# Patient Record
Sex: Male | Born: 1955 | Race: White | Hispanic: No | Marital: Married | Smoking: Former smoker
Health system: Southern US, Community
[De-identification: ages and names within clinical notes are randomized; demographics above are authoritative.]

## PROBLEM LIST (undated history)

## (undated) DIAGNOSIS — K219 Gastro-esophageal reflux disease without esophagitis: Secondary | ICD-10-CM

## (undated) DIAGNOSIS — M5412 Radiculopathy, cervical region: Secondary | ICD-10-CM

## (undated) DIAGNOSIS — M1711 Unilateral primary osteoarthritis, right knee: Secondary | ICD-10-CM

## (undated) DIAGNOSIS — E785 Hyperlipidemia, unspecified: Secondary | ICD-10-CM

## (undated) DIAGNOSIS — M545 Low back pain, unspecified: Secondary | ICD-10-CM

## (undated) HISTORY — PX: NASAL SINUS SURGERY: SHX719

## (undated) HISTORY — PX: COLONOSCOPY: SHX174

## (undated) HISTORY — DX: Radiculopathy, cervical region: M54.12

## (undated) HISTORY — PX: HEMORRHOID SURGERY: SHX153

## (undated) HISTORY — DX: Hyperlipidemia, unspecified: E78.5

## (undated) HISTORY — DX: Unilateral primary osteoarthritis, right knee: M17.11

## (undated) HISTORY — DX: Gastro-esophageal reflux disease without esophagitis: K21.9

## (undated) HISTORY — DX: Low back pain: M54.5

## (undated) HISTORY — DX: Low back pain, unspecified: M54.50

---

## 2010-05-22 ENCOUNTER — Other Ambulatory Visit: Payer: Self-pay | Admitting: Chiropractic Medicine

## 2010-05-22 DIAGNOSIS — M545 Low back pain: Secondary | ICD-10-CM

## 2010-05-22 DIAGNOSIS — M999 Biomechanical lesion, unspecified: Secondary | ICD-10-CM

## 2010-05-22 DIAGNOSIS — M546 Pain in thoracic spine: Secondary | ICD-10-CM

## 2010-05-28 ENCOUNTER — Other Ambulatory Visit: Payer: Self-pay

## 2010-09-05 ENCOUNTER — Other Ambulatory Visit: Payer: Self-pay | Admitting: Specialist

## 2010-09-05 DIAGNOSIS — M549 Dorsalgia, unspecified: Secondary | ICD-10-CM

## 2011-04-02 ENCOUNTER — Encounter: Payer: Self-pay | Admitting: Family Medicine

## 2011-04-02 ENCOUNTER — Ambulatory Visit (INDEPENDENT_AMBULATORY_CARE_PROVIDER_SITE_OTHER): Payer: PRIVATE HEALTH INSURANCE | Admitting: Family Medicine

## 2011-04-02 VITALS — BP 120/83 | HR 67 | Temp 98.1°F | Ht 71.5 in | Wt 182.0 lb

## 2011-04-02 DIAGNOSIS — K219 Gastro-esophageal reflux disease without esophagitis: Secondary | ICD-10-CM | POA: Insufficient documentation

## 2011-04-02 DIAGNOSIS — M7750 Other enthesopathy of unspecified foot: Secondary | ICD-10-CM

## 2011-04-02 DIAGNOSIS — Z0000 Encounter for general adult medical examination without abnormal findings: Secondary | ICD-10-CM

## 2011-04-02 DIAGNOSIS — Z1283 Encounter for screening for malignant neoplasm of skin: Secondary | ICD-10-CM

## 2011-04-02 DIAGNOSIS — Z125 Encounter for screening for malignant neoplasm of prostate: Secondary | ICD-10-CM

## 2011-04-02 DIAGNOSIS — M7741 Metatarsalgia, right foot: Secondary | ICD-10-CM | POA: Insufficient documentation

## 2011-04-02 DIAGNOSIS — Z23 Encounter for immunization: Secondary | ICD-10-CM

## 2011-04-02 MED ORDER — OMEPRAZOLE 20 MG PO CPDR: 20.0000 mg | DELAYED_RELEASE_CAPSULE | Freq: Every day | ORAL | Status: DC

## 2011-04-02 NOTE — Assessment & Plan Note (Signed)
DRE normal today. PSA to be done with fasting blood work soon.

## 2011-04-02 NOTE — Assessment & Plan Note (Signed)
Recommended metatarsal pad for right foot, continue with relative rest, and I gave samples of celebrex 200mg  to take 1 qd with food x 10d. Therapeutic expectations and side effect profile of medication discussed today.  Patient's questions answered.

## 2011-04-02 NOTE — Progress Notes (Signed)
Office Note 04/02/2011  CC:  Chief Complaint  Patient presents with  . Establish Care    wants CPE, no problems    HPI:  Brandon Nunez is a 56 y.o. White male who is here to establish care, get CPE. Patient's most recent primary MD: Dr. Alanda Slim in [facility] [place]. Old records were not reviewed prior to or during today's visit.  Has long hx of GERD, has been treating only with antacids daily, says he never has been on a PPI or H2 blocker long-term.  Has had endoscopy in the past, sounds like gastritis was noted, took PPI briefly after this and adjusted diet briefly.  However, he says he won't adjust diet now--"I live on spicy food". Has bad halitosis and wonders if it could be connected to his GERD.  Also describes hx of (distant past) episodes of unexplained vague chest pains, nonexertional---wonders now if these were GERD related (felt different than his typical heartburn).  Also, about 1 wk hx of pain on bottom of right foot in the area of the ball of the foot, has been doing stair stepper exercise more lately---low impact but repetitive.  No running lately.  NO pain unless he is wt bearing.  It is causing him to limp, but he feels like it is getting better the last 1-2 days.  No heel pain.    Past Medical History  Diagnosis Date  . GERD (gastroesophageal reflux disease)   . Low back pain, episodic     MRI unremarkable at orthopedist's 06-19-10    Past Surgical History  Procedure Date  . Hemorrhoid surgery approx 1998  . Colonoscopy approx Jun 18, 2008    Normal pet pt: was told rpt 10 yrs    Family History  Problem Relation Age of Onset  . Cancer Mother     skin (unknown type per pt)  . Cancer Sister     lung (+smoker)    History   Social History  . Marital Status: Married    Spouse Name: N/A    Number of Children: N/A  . Years of Education: N/A   Occupational History  . Not on file.   Social History Main Topics  . Smoking status: Current Some Day Smoker   Types: Cigars  . Smokeless tobacco: Never Used  . Alcohol Use: Yes     wine, whiskey daily  . Drug Use: No  . Sexually Active: Not on file   Other Topics Concern  . 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    MEDS: Rolaids or tums daily  No Known Allergies  ROS Review of Systems  Constitutional: Negative for fever, chills, appetite change and fatigue.  HENT: Negative for ear pain, congestion, sore throat, neck stiffness and dental problem.   Eyes: Negative for discharge, redness and visual disturbance.  Respiratory: Negative for cough, chest tightness, shortness of breath and wheezing.   Cardiovascular: Negative for chest pain, palpitations and leg swelling.  Gastrointestinal: Negative for nausea, vomiting, abdominal pain, diarrhea and blood in stool.  Genitourinary: Negative for dysuria, urgency, frequency, hematuria, flank  pain and difficulty urinating.  Musculoskeletal: Positive for gait problem (limp, see HPI). Negative for myalgias, back pain, joint swelling and arthralgias.  Skin: Negative for pallor and rash.  Neurological: Negative for dizziness, speech difficulty, weakness and headaches.  Hematological: Negative for adenopathy. Does not bruise/bleed easily.  Psychiatric/Behavioral: Negative for confusion, sleep disturbance and dysphoric mood. The patient is not nervous/anxious.     PE; Blood pressure 120/83, pulse 67, temperature 98.1 F (36.7 C), temperature source Temporal, height 5' 11.5" (1.816 m), weight 182 lb (82.555 kg), SpO2  97.00%. Gen: Alert, well appearing.  Patient is oriented to person, place, time, and situation. ENT: Ears: EACs clear, normal epithelium.  TMs with good light reflex and landmarks bilaterally.  Eyes: no injection, icteris, swelling, or exudate.  EOMI, PERRLA. Nose: no drainage or turbinate edema/swelling.  No injection or focal lesion.  Mouth: lips without lesion/swelling.  Oral mucosa pink and moist.  Dentition intact and without obvious caries or gingival swelling.  Oropharynx without erythema, exudate, or swelling.  Neck - No masses or thyromegaly or limitation in range of motion CV: RRR, no m/r/g.   LUNGS: CTA bilat, nonlabored resps, good aeration in all lung fields. ABD: soft, NT, ND, BS normal.  No hepatospenomegaly or mass.  No bruits. EXT: no clubbing, cyanosis, or edema. Right plantar surface with very mild TTP in the area around the metatarsal heads diffusely.  No swelling. No erythema.  No TTP of plantar fascia or heel. Genitals normal; both testes normal without tenderness, masses, hydroceles, varicoceles, erythema or swelling. Shaft normal, uncircumcised, meatus normal without discharge. No inguinal hernia noted. No inguinal lymphadenopathy. Rectal exam: several perianal skin tags noted externally, no internal lesions or tenderness; prostate small, smooth and without nodularity or tenderness.  Pertinent labs:  None today  ASSESSMENT AND PLAN:   New pt: obtain old records.  Health maintenance examination Reviewed age and gender appropriate health maintenance issues (prudent diet, regular exercise, health risks of tobacco and excessive alcohol, use of seatbelts, fire alarms in home, use of sunscreen).  Also reviewed age and gender appropriate health screening as well as vaccine recommendations. He is UTD on colon cancer screening: repeat colonoscopy approx 2020. He declined flu vaccine today. He did accept the Tdap vaccine today. He'll make lab appt ASAP to return for fasting  lipids, CBC, CMET, and TSH.  Skin cancer screening No atypical lesions noted on exam today. Pt says he has a routine wherever he lives to go to a skin doctor annually for a full skin check so he requested referral today for this.  I ordered a referral to Dr. Jorja Loa for this.  Prostate cancer screening DRE normal today. PSA to be done with fasting blood work soon.  Metatarsalgia of right foot Recommended metatarsal pad for right foot, continue with relative rest, and I gave samples of celebrex 200mg  to take 1 qd with food x 10d. Therapeutic expectations and side effect profile of medication discussed today.  Patient's questions answered.   GERD (gastroesophageal reflux disease) With halitosis and hx of atypical CP : start daily PPI--prilosec OTC 20mg  rx'd today, 1 tab po qd, #30, RF x 11.      Return for Lab visit ASAP for fasting lab work.  Next office visit in 1 yr for CPE.

## 2011-04-02 NOTE — Assessment & Plan Note (Addendum)
Reviewed age and gender appropriate health maintenance issues (prudent diet, regular exercise, health risks of tobacco and excessive alcohol, use of seatbelts, fire alarms in home, use of sunscreen).  Also reviewed age and gender appropriate health screening as well as vaccine recommendations. He is UTD on colon cancer screening: repeat colonoscopy approx 2020. He declined flu vaccine today. He did accept the Tdap vaccine today. He'll make lab appt ASAP to return for fasting lipids, CBC, CMET, and TSH.

## 2011-04-02 NOTE — Assessment & Plan Note (Signed)
No atypical lesions noted on exam today. Pt says he has a routine wherever he lives to go to a skin doctor annually for a full skin check so he requested referral today for this.  I ordered a referral to Dr. Jorja Loa for this.

## 2011-04-02 NOTE — Assessment & Plan Note (Signed)
With halitosis and hx of atypical CP : start daily PPI--prilosec OTC 20mg  rx'd today, 1 tab po qd, #30, RF x 11.

## 2011-04-08 ENCOUNTER — Other Ambulatory Visit (INDEPENDENT_AMBULATORY_CARE_PROVIDER_SITE_OTHER): Payer: PRIVATE HEALTH INSURANCE

## 2011-04-08 DIAGNOSIS — Z0000 Encounter for general adult medical examination without abnormal findings: Secondary | ICD-10-CM

## 2011-04-08 DIAGNOSIS — Z125 Encounter for screening for malignant neoplasm of prostate: Secondary | ICD-10-CM

## 2011-04-08 LAB — LIPID PANEL
Cholesterol: 202 mg/dL — ABNORMAL HIGH (ref 0–200)
HDL: 40.9 mg/dL (ref >39.00)
VLDL: 18.4 mg/dL (ref 0.0–40.0)

## 2011-04-08 LAB — COMPREHENSIVE METABOLIC PANEL
AST: 23 U/L (ref 0–37)
Albumin: 3.9 g/dL (ref 3.5–5.2)
Alkaline Phosphatase: 56 U/L (ref 39–117)
BUN: 18 mg/dL (ref 6–23)
Calcium: 8.8 mg/dL (ref 8.4–10.5)
Creatinine, Ser: 1.1 mg/dL (ref 0.4–1.5)
Glucose, Bld: 104 mg/dL — ABNORMAL HIGH (ref 70–99)

## 2011-04-08 LAB — CBC WITH DIFFERENTIAL/PLATELET
Basophils Absolute: 0 10*3/uL (ref 0.0–0.1)
Eosinophils Absolute: 0.2 10*3/uL (ref 0.0–0.7)
Lymphocytes Relative: 30.9 % (ref 12.0–46.0)
MCHC: 33.2 g/dL (ref 30.0–36.0)
Monocytes Relative: 6.4 % (ref 3.0–12.0)
Platelets: 151 10*3/uL (ref 150.0–400.0)
RDW: 14.5 % (ref 11.5–14.6)

## 2011-04-08 LAB — TSH: TSH: 2.23 u[IU]/mL (ref 0.35–5.50)

## 2011-04-08 LAB — PSA: PSA: 1.85 ng/mL (ref 0.10–4.00)

## 2011-04-08 LAB — LDL CHOLESTEROL, DIRECT: Direct LDL: 153.5 mg/dL

## 2011-04-26 ENCOUNTER — Encounter: Payer: Self-pay | Admitting: Family Medicine

## 2011-04-26 ENCOUNTER — Ambulatory Visit (INDEPENDENT_AMBULATORY_CARE_PROVIDER_SITE_OTHER): Payer: PRIVATE HEALTH INSURANCE | Admitting: Family Medicine

## 2011-04-26 VITALS — BP 117/80 | HR 52 | Temp 98.0°F | Ht 71.5 in | Wt 183.0 lb

## 2011-04-26 DIAGNOSIS — IMO0002 Reserved for concepts with insufficient information to code with codable children: Secondary | ICD-10-CM

## 2011-04-26 DIAGNOSIS — M7050 Other bursitis of knee, unspecified knee: Secondary | ICD-10-CM | POA: Insufficient documentation

## 2011-04-26 NOTE — Assessment & Plan Note (Signed)
Discussed dx; likely resulted from recently altered gait secondary to right foot pain. Decided to continue with conservative tx but be a bit more aggressive with voltaren gel (use qid) and use knee sleeve for a bit more immobilization, encouraged limited wt bearing and workouts until this resolves, continue ice 1-2 times per day, sleep with pillow between knees. If not improved in 1 wk, return for steroid injection.

## 2011-04-26 NOTE — Progress Notes (Signed)
OFFICE NOTE  05/06/2011  CC:  Chief Complaint  Patient presents with  . Knee Pain    right; x 2 weeks; no known injury     HPI: Patient is a 56 y.o. Caucasian male who is here for right knee pain. Started about 2 wks ago when he was having to alter his gait b/c of right foot metatarsalgia (which is resolved now).  No known acute injury.  Minimal swelling at onset and this has essentially resolved now.  No redness noted.  Pain only with wt bearing, occ buckling due to pain, no locking up.  Points to inferior and medial/inferior patellar region, near proximal tibia  Has used voltaren gel occasionally without effect.  Pertinent PMH:  Past Medical History  Diagnosis Date  . GERD (gastroesophageal reflux disease)   . Low back pain, episodic     MRI unremarkable at orthopedist's 2012  No hx of knee problems.   +hx of metatarsalgia on right foot: now resolved.  MEDS:  Outpatient Prescriptions Prior to Visit  Medication Sig Dispense Refill  . omeprazole (PRILOSEC) 20 MG capsule Take 1 capsule (20 mg total) by mouth daily.  30 capsule  11    PE: Blood pressure 117/80, pulse 52, temperature 98 F (36.7 C), temperature source Temporal, height 5' 11.5" (1.816 m), weight 183 lb (83.008 kg). Gen: Alert, well appearing.  Patient is oriented to person, place, time, and situation. Right knee: no erythema, no warmth, no swelling.  He has full ROM of knee without pain, with no instability medially, laterally, anteriorly, or posteriorly.  He has moderate TTP over the pes anserine bursa area focally, pain with resisted right thigh extension with lower leg in fully extended position.  No tenderness of patella, peripatellar soft tissues, joint line, or patellar tendon areas.   IMPRESSION AND PLAN:  Pes anserine bursitis Discussed dx; likely resulted from recently altered gait secondary to right foot pain. Decided to continue with conservative tx but be a bit more aggressive with voltaren gel (use  qid) and use knee sleeve for a bit more immobilization, encouraged limited wt bearing and workouts until this resolves, continue ice 1-2 times per day, sleep with pillow between knees. If not improved in 1 wk, return for steroid injection.      FOLLOW UP:  1 wk prn

## 2011-05-03 ENCOUNTER — Telehealth: Payer: Self-pay | Admitting: Family Medicine

## 2011-05-03 NOTE — Telephone Encounter (Signed)
Please advise 

## 2011-05-03 NOTE — Telephone Encounter (Signed)
Please notify him Dr Milinda Cave is out for next week or so, I would be happy to refer him to sports medicine for shots and further management if he would like

## 2011-05-06 ENCOUNTER — Ambulatory Visit (HOSPITAL_BASED_OUTPATIENT_CLINIC_OR_DEPARTMENT_OTHER)
Admission: RE | Admit: 2011-05-06 | Discharge: 2011-05-06 | Disposition: A | Discharge status: Home / Self Care | Payer: No Typology Code available for payment source | Source: Ambulatory Visit | Attending: Family Medicine | Admitting: Family Medicine

## 2011-05-06 ENCOUNTER — Other Ambulatory Visit: Payer: Self-pay | Admitting: Family Medicine

## 2011-05-06 DIAGNOSIS — M1710 Unilateral primary osteoarthritis, unspecified knee: Secondary | ICD-10-CM

## 2011-05-06 DIAGNOSIS — M25561 Pain in right knee: Secondary | ICD-10-CM

## 2011-05-06 DIAGNOSIS — M1120 Other chondrocalcinosis, unspecified site: Secondary | ICD-10-CM

## 2011-05-06 DIAGNOSIS — M25569 Pain in unspecified knee: Secondary | ICD-10-CM

## 2011-05-06 DIAGNOSIS — IMO0002 Reserved for concepts with insufficient information to code with codable children: Secondary | ICD-10-CM

## 2011-05-06 NOTE — Telephone Encounter (Signed)
Patient called back, he would like to have an xray of his knee first, can he go to the [facility] in [place]?

## 2011-05-06 NOTE — Telephone Encounter (Signed)
Xray of knee to be done at [facility] is in, please let him know he can go anytime and I will review xray

## 2011-05-06 NOTE — Telephone Encounter (Signed)
Advised patient that Dr Milinda Cave can do the injection next week or that Dr Abner Greenspan will put in referral with [facility]. Patient said he will CB after he decides what to do.

## 2011-05-07 ENCOUNTER — Other Ambulatory Visit: Payer: Self-pay | Admitting: Family Medicine

## 2011-05-07 MED ORDER — OMEPRAZOLE 20 MG PO CPDR: 20.0000 mg | DELAYED_RELEASE_CAPSULE | Freq: Every day | ORAL | Status: DC

## 2011-05-07 NOTE — Telephone Encounter (Signed)
Resent Omeprazole to pharmacy

## 2011-05-10 NOTE — Telephone Encounter (Signed)
Patient had xray 05/06/11 and has follow up with Dr Milinda Cave 05/13/11

## 2011-05-13 ENCOUNTER — Ambulatory Visit: Payer: PRIVATE HEALTH INSURANCE | Admitting: Family Medicine

## 2011-05-13 ENCOUNTER — Ambulatory Visit (INDEPENDENT_AMBULATORY_CARE_PROVIDER_SITE_OTHER): Payer: PRIVATE HEALTH INSURANCE | Admitting: Family Medicine

## 2011-05-13 ENCOUNTER — Encounter: Payer: Self-pay | Admitting: Family Medicine

## 2011-05-13 VITALS — BP 113/84 | HR 67 | Ht 71.5 in | Wt 186.0 lb

## 2011-05-13 DIAGNOSIS — M1710 Unilateral primary osteoarthritis, unspecified knee: Secondary | ICD-10-CM

## 2011-05-13 DIAGNOSIS — M13161 Monoarthritis, not elsewhere classified, right knee: Secondary | ICD-10-CM

## 2011-05-13 MED ORDER — METHYLPREDNISOLONE ACETATE 40 MG/ML IJ SUSP
40.0000 mg | Freq: Once | INTRAMUSCULAR | Status: DC
  Administered 2011-05-13: 40 mg via INTRA_ARTICULAR

## 2011-05-13 NOTE — Patient Instructions (Signed)
Ice knee with compression wrap for 20 min every 4 hours for the next 24h. Rest knee as much as you can for the next 24h--do ROM exercises but minimize wt bearing.

## 2011-05-13 NOTE — Progress Notes (Signed)
OFFICE NOTE  05/13/2011  CC:  Chief Complaint  Patient presents with  . Knee Pain    discuss knee pain and xray results     HPI: Patient is a 56 y.o. Caucasian male who is here for f/u knee pain. I diagnosed him with pes anserine bursitis a couple of weeks ago. He called back saying nothing was improving so an x-ray of knee was done and this showed mild osteoarthritis with mild chondrocalcinosis.  Still having right knee pain. Wife moved his knee cap recently and it hurt and also full flexion brings on pain. Discomfort without weight bearing, MUCH worse with wt bearing.  Using ice intermittently. NO warmth, swelling, or erythema.  The pain in area of pes anserine bursa seems to be gone now.   Pertinent PMH:  Past Medical History  Diagnosis Date  . GERD (gastroesophageal reflux disease)   . Low back pain, episodic     MRI unremarkable at orthopedist's 2012    MEDS:  Outpatient Prescriptions Prior to Visit  Medication Sig Dispense Refill  . omeprazole (PRILOSEC) 20 MG capsule Take 1 capsule (20 mg total) by mouth daily.  30 capsule  11    PE: Blood pressure 113/84, pulse 67, height 5' 11.5" (1.816 m), weight 186 lb (84.369 kg). Gen: Alert, well appearing.  Patient is oriented to person, place, time, and situation. Right knee without erythema, deformity, effusion, or warmth.  Mild TTP in peripatellar regions lat and med, with no TTP of patellar tendon or pes anserine bursa area.  No instability of lat or med coll ligaments, no ACL or PCL instability.  Mild pain with full flexion of right knee.  McMurray's negative. Left knee completely normal.  IMPRESSION AND PLAN: Right knee patellofemoral arthritis-degenerative. No sign of gout, pseudogout, or other inflammitory arthritis.   No sign of tendon/bursa inflammation.   Not improving with conservative measures/time/rest. Wants to proceed with trial of steroid injection in right knee today.   Procedure: Therapeutic knee  injection.  The patient's clinical condition is marked by substantial pain and/or significant functional disability.  Other conservative therapy has not provided relief, is contraindicated, or not appropriate.  There is a reasonable likelihood that injection will significantly improve the patient's pain and/or functional disability.  Consent form reviewed, signed. Cleaned skin with alcohol swab, used lateral approach to enter knee joint, Injected 1 ml of 40mg /ml depo-medrol + 2 cc of 2% lidocaine without epi into joint space without resistance.  No immediate complications.  Patient tolerated procedure well.  Post-injection care discussed, including 20 min of icing 1-2 times in the next 4-8 hours, frequent non weight-bearing ROM exercises over the next few days, and general pain medication management.    FOLLOW UP: prn

## 2011-10-23 ENCOUNTER — Other Ambulatory Visit: Payer: PRIVATE HEALTH INSURANCE

## 2011-11-11 ENCOUNTER — Other Ambulatory Visit: Payer: PRIVATE HEALTH INSURANCE

## 2011-11-11 ENCOUNTER — Ambulatory Visit (INDEPENDENT_AMBULATORY_CARE_PROVIDER_SITE_OTHER): Payer: PRIVATE HEALTH INSURANCE | Admitting: Family Medicine

## 2011-11-11 ENCOUNTER — Encounter: Payer: Self-pay | Admitting: Family Medicine

## 2011-11-11 VITALS — BP 123/77 | HR 70 | Ht 71.5 in | Wt 184.0 lb

## 2011-11-11 DIAGNOSIS — J309 Allergic rhinitis, unspecified: Secondary | ICD-10-CM | POA: Insufficient documentation

## 2011-11-11 DIAGNOSIS — H6980 Other specified disorders of Eustachian tube, unspecified ear: Secondary | ICD-10-CM

## 2011-11-11 MED ORDER — FLUTICASONE PROPIONATE 50 MCG/ACT NA SUSP: NASAL | Status: DC

## 2011-11-11 NOTE — Progress Notes (Signed)
OFFICE NOTE  11/11/2011  CC:  Chief Complaint  Patient presents with  . Pain    left ear pain since Friday     HPI: Patient is a 56 y.o. Caucasian male who is here for left ear pain. Onset 3-4 days ago, intermittent, sharp pains right in the middle of the ear--sometimes one and sometimes 2-3 in a row.  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  No hx of ear surgery. He has had sinus surgery in the past.   He does have eustacian tube dysfunction and a couple of weeks of nasal/sinus congestion and fullness. Some nasal mucous-clear and sometimes blood tinged.  +Sneezing and itching. 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  No hearing deficit that he knows of.  No vertigo.  Pertinent PMH:  Past Medical History  Diagnosis Date  . GERD (gastroesophageal reflux disease)   . Low back pain, episodic     MRI unremarkable at orthopedist's 2012  Allergic rhinitis/sinusitis (hx of sinus surgery in distant past).  MEDS:  Outpatient Prescriptions Prior to Visit  Medication Sig Dispense Refill  . omeprazole (PRILOSEC) 20 MG capsule Take 1 capsule (20 mg total) by mouth daily.  30 capsule  11    PE: Blood pressure 123/77, pulse 70, height 5' 11.5" (1.816 m), weight 184 lb (83.462 kg), SpO2 93.00%. Gen: Alert, well appearing.  Patient is oriented to person, place, time, and situation. ENT: Ears: EACs clear, normal epithelium.  TMs with good light reflex and landmarks bilaterally.  Eyes: no injection, icteris, swelling, or exudate.  EOMI, PERRLA. Nose: no drainage or turbinate edema/swelling.  No injection or focal lesion.  Mouth: lips without lesion/swelling.  Oral mucosa pink and moist.  Dentition intact and without obvious caries or gingival swelling.  Oropharynx without erythema, exudate, or swelling.  Neck - No masses or thyromegaly or limitation in range of motion CV: RRR, no m/r/g.   LUNGS: CTA bilat, nonlabored resps, good aeration in  all lung fields.  IMPRESSION AND PLAN:  Allergic rhinitis With eustacian tube dysfunction. Discussed need to try starting a longterm controller med, decided to do trial of flonase 2 sprays each nostril qd.    FOLLOW UP: 4-6 mo, earlier if worsening sx's.

## 2011-11-12 ENCOUNTER — Encounter: Payer: Self-pay | Admitting: Family Medicine

## 2011-11-12 NOTE — Assessment & Plan Note (Signed)
With eustacian tube dysfunction. Discussed need to try starting a longterm controller med, decided to do trial of flonase 2 sprays each nostril qd.

## 2011-11-25 ENCOUNTER — Other Ambulatory Visit: Payer: Self-pay | Admitting: Family Medicine

## 2011-11-25 NOTE — Telephone Encounter (Signed)
Pls notify patient that his "traditional" cholesterol panel showed no changes compared to 8 months ago.  Additionally, his "advanced" cholesterol marker testing showed several results that indicate he is at high risk for CV disease and I recommend he start a cholesterol-lowering med. If agreeable, pls rx atorvastatin 10mg  qd, #30, RF x 2.  Recheck HDL labs with AST/ALT in 2-3 months (lab visit for this, but have him make office appt with me for a couple of weeks after the lab visit to go over the results/discuss in detail.-thx

## 2011-11-26 NOTE — Telephone Encounter (Signed)
Notified of below.  Pt would like copy of labs mailed to his home address.  He will decide about meds after looking at results.  Copy of HDL and lipid panel mailed to patient.

## 2011-11-28 ENCOUNTER — Encounter: Payer: Self-pay | Admitting: Family Medicine

## 2012-02-29 ENCOUNTER — Other Ambulatory Visit: Payer: Self-pay

## 2012-04-16 ENCOUNTER — Encounter: Payer: Self-pay | Admitting: Family Medicine

## 2012-04-16 ENCOUNTER — Ambulatory Visit (INDEPENDENT_AMBULATORY_CARE_PROVIDER_SITE_OTHER): Payer: 59 | Admitting: Family Medicine

## 2012-04-16 VITALS — BP 120/74 | HR 64 | Temp 98.4°F | Ht 71.5 in | Wt 189.0 lb

## 2012-04-16 DIAGNOSIS — M5412 Radiculopathy, cervical region: Secondary | ICD-10-CM

## 2012-04-16 DIAGNOSIS — M5010 Cervical disc disorder with radiculopathy, unspecified cervical region: Secondary | ICD-10-CM

## 2012-04-16 MED ORDER — PREDNISONE 20 MG PO TABS: ORAL_TABLET | ORAL | Status: DC

## 2012-04-16 NOTE — Progress Notes (Signed)
OFFICE NOTE  04/16/2012  CC:  Chief Complaint  Patient presents with  . Shoulder Pain    left sided x 6 weeks     HPI: Patient is a 57 y.o. Caucasian male who is here for left shoulder and arm pain. Six weeks history, feels pain in shoulder and left upper pectoral region, tingling/numbness in left arm constantly.  Exertion does not trigger it or worsen it.  No neck pain. Laying on the left shoulder hurts.  Denies SOB, nausea, diaphoresis, palpitations, or dizziness with these sx's. He works out at [employer] and weights--and it has no effect on his sx's.  No hx of neck trauma/injury. He has taken no meds and has never had imaging of his neck or PT for any neck or shoulder problem.  Pertinent PMH:  Past Medical History  Diagnosis Date  . GERD (gastroesophageal reflux disease)   . Low back pain, episodic     MRI unremarkable at orthopedist's 2012   Past Surgical History  Procedure Laterality Date  . Hemorrhoid surgery  approx 1998  . Colonoscopy  approx 2010    Normal pet pt: was told rpt 10 yrs  . Nasal sinus surgery      MEDS:  Outpatient Prescriptions Prior to Visit  Medication Sig Dispense Refill  . fluticasone (FLONASE) 50 MCG/ACT nasal spray 2 sprays each nostril once daily, best taken in the morning.  16 g  12  . omeprazole (PRILOSEC) 20 MG capsule Take 1 capsule (20 mg total) by mouth daily.  30 capsule  11   No facility-administered medications prior to visit.    PE: Blood pressure 120/74, pulse 64, temperature 98.4 F (36.9 C), temperature source Temporal, height 5' 11.5" (1.816 m), weight 189 lb (85.73 kg). Gen: Alert, well appearing.  Patient is oriented to person, place, time, and situation. Neck: ROM fully intact. No tenderness of neck or shoulders. ROM of shoulders elicits no pain or paresthesias.  UE strength 5/5 prox and dist. Spurling's negative. Sensation (fine touch) intact in all areas of UE's bilat.    IMPRESSION AND PLAN:  Cervical  disc disorder with radiculopathy of cervical region Prednisone 40mg  qd x 5d. After this, ibuprofen 600mg  bid with food x 2 wks.   Referral to PT in [facility]. F/u in 6 wks.  An After Visit Summary was printed and given to the patient.  FOLLOW UP: 6 wks

## 2012-04-16 NOTE — Patient Instructions (Signed)
After you finish the prednisone, take ibuprofen OTC- 3 of the 200mg  tabs twice per day with food---for 2 wks, then stop.

## 2012-04-19 ENCOUNTER — Encounter: Payer: Self-pay | Admitting: Family Medicine

## 2012-04-19 NOTE — Assessment & Plan Note (Signed)
Prednisone 40mg  qd x 5d. After this, ibuprofen 600mg  bid with food x 2 wks.   Referral to PT in [facility]. F/u in 6 wks.

## 2012-08-20 ENCOUNTER — Encounter: Payer: Self-pay | Admitting: Family Medicine

## 2012-08-20 ENCOUNTER — Encounter (INDEPENDENT_AMBULATORY_CARE_PROVIDER_SITE_OTHER): Payer: 59 | Admitting: Family Medicine

## 2012-08-20 ENCOUNTER — Other Ambulatory Visit (INDEPENDENT_AMBULATORY_CARE_PROVIDER_SITE_OTHER): Payer: 59

## 2012-08-20 ENCOUNTER — Other Ambulatory Visit: Payer: Self-pay | Admitting: Family Medicine

## 2012-08-20 DIAGNOSIS — Z0000 Encounter for general adult medical examination without abnormal findings: Secondary | ICD-10-CM

## 2012-08-20 LAB — CBC WITH DIFFERENTIAL/PLATELET
Basophils Absolute: 0 10*3/uL (ref 0.0–0.1)
Eosinophils Relative: 2 % (ref 0–5)
HCT: 43.5 % (ref 39.0–52.0)
Lymphocytes Relative: 28 % (ref 12–46)
MCHC: 34 g/dL (ref 30.0–36.0)
MCV: 89 fL (ref 78.0–100.0)
Monocytes Absolute: 0.4 10*3/uL (ref 0.1–1.0)
Monocytes Relative: 7 % (ref 3–12)
RDW: 14.3 % (ref 11.5–15.5)
WBC: 5.3 10*3/uL (ref 4.0–10.5)

## 2012-08-20 NOTE — Progress Notes (Signed)
Patient ID: Brandon Nunez, male   DOB: [birth year], 57 y.o.   MRN: [id number] A user error has taken place: encounter opened in error, closed for administrative reasons.

## 2012-08-20 NOTE — Progress Notes (Signed)
Labs only

## 2012-08-21 LAB — LIPID PANEL
HDL: 42 mg/dL (ref >39)
Triglycerides: 60 mg/dL (ref <150)

## 2012-08-21 LAB — COMPREHENSIVE METABOLIC PANEL
Albumin: 4.1 g/dL (ref 3.5–5.2)
BUN: 14 mg/dL (ref 6–23)
Calcium: 9.3 mg/dL (ref 8.4–10.5)
Chloride: 105 mEq/L (ref 96–112)
Glucose, Bld: 107 mg/dL — ABNORMAL HIGH (ref 70–99)
Potassium: 4.4 mEq/L (ref 3.5–5.3)

## 2012-08-27 ENCOUNTER — Ambulatory Visit (INDEPENDENT_AMBULATORY_CARE_PROVIDER_SITE_OTHER): Payer: 59 | Admitting: Family Medicine

## 2012-08-27 ENCOUNTER — Encounter: Payer: Self-pay | Admitting: Family Medicine

## 2012-08-27 VITALS — BP 121/81 | HR 65 | Temp 98.2°F | Resp 16 | Ht 71.5 in | Wt 187.0 lb

## 2012-08-27 DIAGNOSIS — K219 Gastro-esophageal reflux disease without esophagitis: Secondary | ICD-10-CM

## 2012-08-27 DIAGNOSIS — Z0000 Encounter for general adult medical examination without abnormal findings: Secondary | ICD-10-CM

## 2012-08-27 MED ORDER — PANTOPRAZOLE SODIUM 40 MG PO TBEC: 40.0000 mg | DELAYED_RELEASE_TABLET | Freq: Every day | ORAL | Status: DC

## 2012-08-27 NOTE — Patient Instructions (Addendum)
Take OTC zantac 75mg  tab at bedtime every night for 2 weeks.

## 2012-08-27 NOTE — Progress Notes (Signed)
Office Note 08/27/2012  CC:  Chief Complaint  Patient presents with  . Annual Exam    HPI:  Brandon Nunez is a 57 y.o. White male who is here for CPE. Reviewed all labs done last week--all good (see below in labs section). Has had 2-3 mo of nagging-type cough, feels some phlegm in back of throat frequently, clears his throat a lot, frequently feels substernal burning after eating---he acknowledges chronic GERD sx's and admits to nonadherence to reflux-type diet b/c he loves those foods.  Denies exertional cp, denies wheezing or sob or doe. No fevers or nasal congestion or feeling of PND.  He took prilosec 20mg  qd at one point but feels like it helped for 1-2 mo and then stopped it b/c it's effect waned.  Denies dysphagia or odynophagia.  Hoarse voice only for a brief period and then it resolved.  No ST.    Past Medical History  Diagnosis Date  . GERD (gastroesophageal reflux disease)   . Low back pain, episodic     MRI unremarkable at orthopedist's Jun 13, 2010    Past Surgical History  Procedure Laterality Date  . Hemorrhoid surgery  approx 1998  . Colonoscopy  approx 12-Jun-2008    Normal pet pt: was told rpt 10 yrs  . Nasal sinus surgery      Family History  Problem Relation Age of Onset  . Cancer Mother     skin (unknown type per pt)  . Cancer Sister     lung (+smoker)    History   Social History  . Marital Status: Married    Spouse Name: N/A    Number of Children: N/A  . Years of Education: N/A   Occupational History  . Not on file.   Social History Main Topics  . Smoking status: Current Some Day Smoker    Types: Cigars  . Smokeless tobacco: Never Used  . Alcohol Use: Yes     Comment: wine, whiskey daily  . Drug Use: No  . Sexual Activity: Not on file   Other Topics Concern  . Not on file   Social History Narrative   Remarried (1st wife died of breast cancer) 06/13/06, has 1 biologic child and 2 stepchildren.   He is one of 11 kids, born in [place], moved  around to many different countries with parents as he grew up.   Relocated to [facility] 2011/12 from [place].   Occupation: Art gallery manager, Engineer, mining.   Used to be a long distance runner, still works out regularly.   Smokes occasional cigar, drinks one glass of wine and one whisky drink nightly.   No hx of drug use.             Outpatient Prescriptions Prior to Visit  Medication Sig Dispense Refill  . predniSONE (DELTASONE) 20 MG tablet 2 tabs po qd x 5d  10 tablet  0   No facility-administered medications prior to visit.    No Known Allergies  ROS Review of Systems  Constitutional: Negative for fever, chills, appetite change and fatigue.  HENT: Negative for ear pain, congestion, sore throat, neck stiffness and dental problem.   Eyes: Negative for discharge, redness and visual disturbance.  Respiratory: Positive for cough (as noted in HPI). Negative for chest tightness, shortness of breath and wheezing.   Cardiovascular: Negative for chest pain, palpitations and leg swelling.  Gastrointestinal: Negative for nausea, vomiting, abdominal pain, diarrhea and blood in stool.  Genitourinary: Negative for dysuria, urgency, frequency, hematuria, flank pain and  difficulty urinating.  Musculoskeletal: Negative for myalgias, back pain, joint swelling and arthralgias.  Skin: Negative for pallor and rash.  Neurological: Negative for dizziness, speech difficulty, weakness and headaches.  Hematological: Negative for adenopathy. Does not bruise/bleed easily.  Psychiatric/Behavioral: Negative for confusion and sleep disturbance. The patient is not nervous/anxious.      PE; Blood pressure 121/81, pulse 65, temperature 98.2 F (36.8 C), temperature source Temporal, resp. rate 16, height 5' 11.5" (1.816 m), weight 187 lb (84.823 kg), SpO2 96.00%. Gen: Alert, well appearing.  Patient is oriented to person, place, time, and situation. AFFECT: pleasant, lucid thought and speech. ENT: Ears: EACs clear,  normal epithelium.  TMs with good light reflex and landmarks bilaterally.  Eyes: no injection, icteris, swelling, or exudate.  EOMI, PERRLA. Nose: no drainage or turbinate edema/swelling.  No injection or focal lesion.  Mouth: lips without lesion/swelling.  Oral mucosa pink and moist.  Dentition intact and without obvious caries or gingival swelling.  Oropharynx without erythema, exudate, or swelling.  Neck: supple/nontender.  No LAD, mass, or TM.  Carotid pulses 2+ bilaterally, without bruits. CV: RRR, no m/r/g.   LUNGS: CTA bilat, nonlabored resps, good aeration in all lung fields. ABD: soft, NT, ND, BS normal.  No hepatospenomegaly or mass.  No bruits. EXT: no clubbing, cyanosis, or edema.  Musculoskeletal: no joint swelling, erythema, warmth, or tenderness.  ROM of all joints intact. Skin - no sores or suspicious lesions or rashes or color changes Rectal exam: negative without mass, lesions or tenderness, PROSTATE EXAM: smooth and symmetric without nodules or tenderness.  Pertinent labs:  Lab Results  Component Value Date   TSH 1.589 08/20/2012   Lab Results  Component Value Date   WBC 5.3 08/20/2012   HGB 14.8 08/20/2012   HCT 43.5 08/20/2012   MCV 89.0 08/20/2012   PLT 162 08/20/2012   Lab Results  Component Value Date   CREATININE 0.92 08/20/2012   BUN 14 08/20/2012   NA 138 08/20/2012   K 4.4 08/20/2012   CL 105 08/20/2012   CO2 25 08/20/2012   Lab Results  Component Value Date   ALT 20 08/20/2012   AST 19 08/20/2012   ALKPHOS 56 08/20/2012   BILITOT 0.4 08/20/2012   Lab Results  Component Value Date   CHOL 183 08/20/2012   Lab Results  Component Value Date   HDL 42 08/20/2012   Lab Results  Component Value Date   LDLCALC 129* 08/20/2012   Lab Results  Component Value Date   TRIG 60 08/20/2012   Lab Results  Component Value Date   CHOLHDL 4.4 08/20/2012   Lab Results  Component Value Date   PSA 1.56 08/20/2012   PSA 1.85 04/08/2011       ASSESSMENT AND PLAN:   Health maintenance  examination Reviewed age and gender appropriate health maintenance issues (prudent diet, regular exercise, health risks of tobacco and excessive alcohol, use of seatbelts, fire alarms in home, use of sunscreen).  Also reviewed age and gender appropriate health screening as well as vaccine recommendations. PSA normal, DRE normal. Colon cancer screening UTD.  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 Needs to comply with anti-reflux diet better--educational handout reviewed with pt and given to him today. Start trial of pantoprazole 40mg  qAM longterm and add zantac 75mg  qhs for short term (2 wks). Discussed long term risks of uncontrolled GERD, including Barrett's esophagus and esophageal cancer. Pt expressed understanding.  An After Visit Summary was printed and given to the patient.  FOLLOW UP:  Return in about 3 months (around 11/27/2012) for f/u GERD/laryngopharygeal reflux.

## 2012-08-27 NOTE — Assessment & Plan Note (Signed)
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 Needs to comply with anti-reflux diet better--educational handout reviewed with pt and given to him today. Start trial of pantoprazole 40mg  qAM longterm and add zantac 75mg  qhs for short term (2 wks). Discussed long term risks of uncontrolled GERD, including Barrett's esophagus and esophageal cancer. Pt expressed understanding.

## 2012-08-27 NOTE — Assessment & Plan Note (Signed)
Reviewed age and gender appropriate health maintenance issues (prudent diet, regular exercise, health risks of tobacco and excessive alcohol, use of seatbelts, fire alarms in home, use of sunscreen).  Also reviewed age and gender appropriate health screening as well as vaccine recommendations. PSA normal, DRE normal. Colon cancer screening UTD.

## 2012-08-28 ENCOUNTER — Telehealth: Payer: Self-pay | Admitting: Emergency Medicine

## 2012-08-28 DIAGNOSIS — R05 Cough: Secondary | ICD-10-CM

## 2012-08-28 NOTE — Telephone Encounter (Signed)
Pt wants to have chest xray. Please advise, patient advised md was out of office

## 2012-08-31 NOTE — Telephone Encounter (Signed)
Called patient to get more details on why he is requesting a chest xray. Patient stated that he and his wife are concerned that he is having some of the same symptoms his wife had before she found out she had cancer. Patient stated that they would feel better just getting everything checked out, to be sure nothing is wrong. Patient stated that it is know rush. Please advise?

## 2012-08-31 NOTE — Telephone Encounter (Signed)
LMOVM for patient to return call.

## 2012-08-31 NOTE — Telephone Encounter (Signed)
CXR ordered to be done at [facility].  Pls let pt know to go get this whenever he wants between 8 a and 5 p.-thx

## 2012-09-03 ENCOUNTER — Ambulatory Visit (HOSPITAL_BASED_OUTPATIENT_CLINIC_OR_DEPARTMENT_OTHER)
Admission: RE | Admit: 2012-09-03 | Discharge: 2012-09-03 | Disposition: A | Discharge status: Home / Self Care | Payer: 59 | Source: Ambulatory Visit | Attending: Family Medicine | Admitting: Family Medicine

## 2012-09-03 DIAGNOSIS — R05 Cough: Secondary | ICD-10-CM

## 2012-09-03 DIAGNOSIS — R059 Cough, unspecified: Secondary | ICD-10-CM | POA: Insufficient documentation

## 2012-09-03 DIAGNOSIS — F17200 Nicotine dependence, unspecified, uncomplicated: Secondary | ICD-10-CM | POA: Insufficient documentation

## 2012-09-04 NOTE — Telephone Encounter (Signed)
Patient stated that he has already had the CXR.

## 2012-11-19 ENCOUNTER — Other Ambulatory Visit: Payer: Self-pay

## 2013-02-05 ENCOUNTER — Encounter: Payer: Self-pay | Admitting: Family Medicine

## 2013-02-05 ENCOUNTER — Ambulatory Visit (INDEPENDENT_AMBULATORY_CARE_PROVIDER_SITE_OTHER): Payer: 59 | Admitting: Family Medicine

## 2013-02-05 VITALS — BP 110/76 | HR 63 | Temp 98.2°F | Resp 16 | Ht 71.5 in | Wt 182.0 lb

## 2013-02-05 DIAGNOSIS — M25569 Pain in unspecified knee: Secondary | ICD-10-CM

## 2013-02-05 DIAGNOSIS — M25562 Pain in left knee: Secondary | ICD-10-CM

## 2013-02-05 MED ORDER — DICLOFENAC SODIUM 75 MG PO TBEC: DELAYED_RELEASE_TABLET | ORAL | Status: DC

## 2013-02-05 NOTE — Progress Notes (Signed)
OFFICE NOTE  02/05/2013  CC:  Chief Complaint  Patient presents with  . Knee Pain    left knee     HPI: Patient is a 58 y.o. Caucasian male who is here for left knee pain. Onset about 2 mo ago after doing a lot of housework, going up and down ladders, playing some golf, bending at the knees to pick things up.  Hurts focally in area medial to knee cap in anterior knee.  No swelling.  No locking or buckling.  Has not tried any meds, has not iced it. Severity waxes and wanes and causes limp at times.  It does not hurt if he is not bearing any weight.  No other joint complaints lately.   Pertinent PMH:  Past Medical History  Diagnosis Date  . GERD (gastroesophageal reflux disease)   . Low back pain, episodic     MRI unremarkable at orthopedist's 2012  . Osteoarthritis of right knee     MEDS:  Outpatient Prescriptions Prior to Visit  Medication Sig Dispense Refill  . pantoprazole (PROTONIX) 40 MG tablet Take 1 tablet (40 mg total) by mouth daily.  30 tablet  5   No facility-administered medications prior to visit.    PE: Blood pressure 110/76, pulse 63, temperature 98.2 F (36.8 C), temperature source Temporal, resp. rate 16, height 5' 11.5" (1.816 m), weight 182 lb (82.555 kg), SpO2 97.00%. Gen: Alert, well appearing.  Patient is oriented to person, place, time, and situation. Knees: no erythema, swelling, warmth, or tenderness anywhere on either knee except some pain with patellar grind on left knee.  ROM intact in both knees, no locking or popping or catching. Lachman's neg bilat.  No pain with varus/valgus stress of left knee.    IMPRESSION AND PLAN:  Left knee pain Exam normal/reassuring. Diclofenac 75mg  bid x 2wks, then bid prn knee pain.  Therapeutic expectations and side effect profile of medication discussed today.  Patient's questions answered. If not improved with this then we'll try steroid knee injection.    An After Visit Summary was printed and given to the  patient.  FOLLOW UP: 2 wks if not improving significantly

## 2013-02-05 NOTE — Progress Notes (Signed)
Pre visit review using our clinic review tool, if applicable. No additional management support is needed unless otherwise documented below in the visit note. 

## 2013-02-05 NOTE — Assessment & Plan Note (Signed)
Exam normal/reassuring. Diclofenac 75mg  bid x 2wks, then bid prn knee pain.  Therapeutic expectations and side effect profile of medication discussed today.  Patient's questions answered. If not improved with this then we'll try steroid knee injection.

## 2013-02-08 ENCOUNTER — Telehealth: Payer: Self-pay | Admitting: Family Medicine

## 2013-02-08 NOTE — Telephone Encounter (Signed)
Relevant patient education assigned to patient using Emmi. ° °

## 2013-07-08 ENCOUNTER — Ambulatory Visit (INDEPENDENT_AMBULATORY_CARE_PROVIDER_SITE_OTHER): Payer: 59 | Admitting: Family Medicine

## 2013-07-08 ENCOUNTER — Encounter: Payer: Self-pay | Admitting: Family Medicine

## 2013-07-08 VITALS — BP 106/70 | HR 65 | Temp 99.0°F | Resp 18 | Ht 71.5 in | Wt 178.0 lb

## 2013-07-08 DIAGNOSIS — L259 Unspecified contact dermatitis, unspecified cause: Secondary | ICD-10-CM | POA: Insufficient documentation

## 2013-07-08 MED ORDER — PREDNISONE 10 MG PO TABS: ORAL_TABLET | ORAL | Status: DC

## 2013-07-08 NOTE — Progress Notes (Signed)
Pre visit review using our clinic review tool, if applicable. No additional management support is needed unless otherwise documented below in the visit note. 

## 2013-07-08 NOTE — Progress Notes (Addendum)
OFFICE NOTE  07/08/2013  CC:  Chief Complaint  Patient presents with  . Annual Exam  . Poison Ivy    right side of body x 2 weeks   HPI: Patient is a 58 y.o. Caucasian male who is here for itchy rash (not time for his CPE yet). Had onset of itchy rash on right forearm about 2 wks ago, little reddish splotches/bumps --has been outside working/golfing and felt like he came across some poison ivy/oak/sumac.  Since then it has also begun to involve his right side of abdomen diffusely--not dermatomal.  Pertinent PMH:  Past medical, surgical, social, and family history reviewed and no changes are noted since last office visit.  MEDS:  Outpatient Prescriptions Prior to Visit  Medication Sig Dispense Refill  . diclofenac (VOLTAREN) 75 MG EC tablet 1 tab po bid x 14d, then 1 tab po bid prn knee pain  30 tablet  1  . pantoprazole (PROTONIX) 40 MG tablet Take 1 tablet (40 mg total) by mouth daily.  30 tablet  5   No facility-administered medications prior to visit.    PE: Blood pressure 106/70, pulse 65, temperature 99 F (37.2 C), temperature source Temporal, resp. rate 18, height 5' 11.5" (1.816 m), weight 178 lb (80.74 kg), SpO2 97.00%. Gen: Alert, well appearing.  Patient is oriented to person, place, time, and situation. SKIN: scattered pink macules and papular rash, some blotches are areas where many of these have coalesced.  These are located on right forearm medial surface and on left side of abdomen.  No vesicles.  IMPRESSION AND PLAN: Contact dermatitis Prednisone 40mg  qd x 7d, then 20mg  qd x 7d, then 10 mg qd x 7d.   An After Visit Summary was printed and given to the patient.  FOLLOW UP: 3 mo for CPE with fasting labs

## 2013-07-08 NOTE — Patient Instructions (Signed)
Call [facility] Jor[facility]feen [facility] for skin exam appt

## 2013-07-18 NOTE — Assessment & Plan Note (Signed)
Prednisone 40mg  qd x 7d, then 20mg  qd x 7d, then 10 mg qd x 7d.

## 2013-07-20 ENCOUNTER — Other Ambulatory Visit: Payer: Self-pay | Admitting: Family Medicine

## 2013-09-28 ENCOUNTER — Ambulatory Visit (INDEPENDENT_AMBULATORY_CARE_PROVIDER_SITE_OTHER): Payer: 59 | Admitting: Family Medicine

## 2013-09-28 ENCOUNTER — Encounter: Payer: Self-pay | Admitting: Family Medicine

## 2013-09-28 VITALS — BP 124/86 | HR 62 | Temp 98.3°F | Resp 16 | Ht 71.5 in | Wt 181.0 lb

## 2013-09-28 DIAGNOSIS — Z125 Encounter for screening for malignant neoplasm of prostate: Secondary | ICD-10-CM

## 2013-09-28 DIAGNOSIS — Z0000 Encounter for general adult medical examination without abnormal findings: Secondary | ICD-10-CM

## 2013-09-28 LAB — LIPID PANEL
Cholesterol: 201 mg/dL — ABNORMAL HIGH (ref 0–200)
HDL: 41.7 mg/dL (ref >39.00)
LDL CALC: 145 mg/dL — AB (ref 0–99)
NONHDL: 159.3
Total CHOL/HDL Ratio: 5
Triglycerides: 72 mg/dL (ref 0.0–149.0)
VLDL: 14.4 mg/dL (ref 0.0–40.0)

## 2013-09-28 LAB — COMPREHENSIVE METABOLIC PANEL
ALK PHOS: 51 U/L (ref 39–117)
ALT: 21 U/L (ref 0–53)
AST: 24 U/L (ref 0–37)
Albumin: 4.1 g/dL (ref 3.5–5.2)
BILIRUBIN TOTAL: 0.8 mg/dL (ref 0.2–1.2)
BUN: 14 mg/dL (ref 6–23)
CO2: 27 mEq/L (ref 19–32)
Calcium: 8.9 mg/dL (ref 8.4–10.5)
Chloride: 105 mEq/L (ref 96–112)
Creatinine, Ser: 1.1 mg/dL (ref 0.4–1.5)
GFR: 73.22 mL/min (ref >60.00)
GLUCOSE: 106 mg/dL — AB (ref 70–99)
Potassium: 4 mEq/L (ref 3.5–5.1)
SODIUM: 138 meq/L (ref 135–145)
TOTAL PROTEIN: 6.4 g/dL (ref 6.0–8.3)

## 2013-09-28 LAB — TSH: TSH: 1.51 u[IU]/mL (ref 0.35–4.50)

## 2013-09-28 LAB — PSA: PSA: 1.78 ng/mL (ref 0.10–4.00)

## 2013-09-28 NOTE — Progress Notes (Signed)
Office Note 09/28/2013  CC:  Chief Complaint  Patient presents with  . Annual Exam    fasting    HPI:  Brandon Nunez is a 58 y.o. White male who is here for CPE. He declines flu vaccine today.  He is overdue for dental f/u. Says he is due for eye exam: has eye MD in [place].  Not exercising any lately.  Working too many long days and has no time.   Past Medical History  Diagnosis Date  . GERD (gastroesophageal reflux disease)   . Low back pain, episodic     MRI unremarkable at orthopedist's 06/02/10  . Osteoarthritis of right knee     Past Surgical History  Procedure Laterality Date  . Hemorrhoid surgery  approx 1998  . Colonoscopy  approx 06/01/08    Normal pet pt: was told rpt 10 yrs  . Nasal sinus surgery      Family History  Problem Relation Age of Onset  . Cancer Mother     skin (unknown type per pt)  . Cancer Sister     lung (+smoker)    History   Social History  . Marital Status: Married    Spouse Name: N/A    Number of Children: N/A  . Years of Education: N/A   Occupational History  . Not on file.   Social History Main Topics  . Smoking status: Current Some Day Smoker    Types: Cigars  . Smokeless tobacco: Never Used  . Alcohol Use: Yes     Comment: wine, whiskey daily  . Drug Use: No  . Sexual Activity: Not on file   Other Topics Concern  . Not on file   Social History Narrative   Remarried (1st wife died of breast cancer) 06-02-06, has 1 biologic child and 2 stepchildren.   He is one of 11 kids, born in [place], moved around to many different countries with parents as he grew up.   Relocated to [facility] 2011/12 from [place].   Occupation: Art gallery manager, Engineer, mining.   Used to be a long distance runner, still works out regularly.   Smokes occasional cigar, drinks one glass of wine and one whisky drink nightly.   No hx of drug use.            MEDS: protonix  qd  No Known Allergies  ROS Review of Systems  Constitutional: Negative for  fever, chills, appetite change and fatigue.  HENT: Negative for congestion, dental problem, ear pain and sore throat.   Eyes: Negative for discharge, redness and visual disturbance.  Respiratory: Negative for cough, chest tightness, shortness of breath and wheezing.   Cardiovascular: Negative for chest pain, palpitations and leg swelling.  Gastrointestinal: Negative for nausea, vomiting, abdominal pain, diarrhea and blood in stool.  Genitourinary: Negative for dysuria, urgency, frequency, hematuria, flank pain and difficulty urinating.  Musculoskeletal: Negative for arthralgias, back pain, joint swelling, myalgias and neck stiffness.  Skin: Negative for pallor and rash.  Neurological: Negative for dizziness, speech difficulty, weakness and headaches.  Hematological: Negative for adenopathy. Does not bruise/bleed easily.  Psychiatric/Behavioral: Negative for confusion and sleep disturbance. The patient is not nervous/anxious.     PE; Blood pressure 124/86, pulse 62, temperature 98.3 F (36.8 C), temperature source Temporal, resp. rate 16, height 5' 11.5" (1.816 m), weight 181 lb (82.101 kg), SpO2 97.00%. Gen: Alert, well appearing.  Patient is oriented to person, place, time, and situation. AFFECT: pleasant, lucid thought and speech. ENT: Ears: EACs clear, normal  epithelium.  TMs with good light reflex and landmarks bilaterally.  Eyes: no injection, icteris, swelling, or exudate.  EOMI, PERRLA. Nose: no drainage or turbinate edema/swelling.  No injection or focal lesion.  Mouth: lips without lesion/swelling.  Oral mucosa pink and moist.  Dentition intact and without obvious caries or gingival swelling.  Oropharynx without erythema, exudate, or swelling.  Neck: supple/nontender.  No LAD, mass, or TM.  Carotid pulses 2+ bilaterally, without bruits. CV: RRR, no m/r/g.   LUNGS: CTA bilat, nonlabored resps, good aeration in all lung fields. ABD: soft, NT, ND, BS normal.  No hepatospenomegaly or  mass.  No bruits. EXT: no clubbing, cyanosis, or edema.  Musculoskeletal: no joint swelling, erythema, warmth, or tenderness.  ROM of all joints intact. Skin - no sores or suspicious lesions or rashes or color changes Rectal exam: negative without mass, lesions or tenderness, PROSTATE EXAM: smooth and symmetric without nodules or tenderness.  Pertinent labs:  None today  ASSESSMENT AND PLAN:   Health maintenance examination Reviewed age and gender appropriate health maintenance issues (prudent diet, regular exercise, health risks of tobacco and excessive alcohol, use of seatbelts, fire alarms in home, use of sunscreen).  Also reviewed age and gender appropriate health screening as well as vaccine recommendations. He declined flu vaccine today. Fasting health panel labs drawn today. PSA screening done today.  DRE normal. He is UTD on colonoscopy/colon cancer screening. He will be visiting his general dermatologist for annual full skin screening exam.   An After Visit Summary was printed and given to the patient.  FOLLOW UP:  Return in about 1 year (around 09/29/2014) for annual CPE with fasting labs the week prior.

## 2013-09-28 NOTE — Assessment & Plan Note (Signed)
Reviewed age and gender appropriate health maintenance issues (prudent diet, regular exercise, health risks of tobacco and excessive alcohol, use of seatbelts, fire alarms in home, use of sunscreen).  Also reviewed age and gender appropriate health screening as well as vaccine recommendations. He declined flu vaccine today. Fasting health panel labs drawn today. PSA screening done today.  DRE normal. He is UTD on colonoscopy/colon cancer screening. He will be visiting his general dermatologist for annual full skin screening exam.

## 2013-09-28 NOTE — Progress Notes (Signed)
Pre visit review using our clinic review tool, if applicable. No additional management support is needed unless otherwise documented below in the visit note. 

## 2013-09-29 LAB — CBC WITH DIFFERENTIAL/PLATELET
BASOS PCT: 0.3 % (ref 0.0–3.0)
Basophils Absolute: 0 10*3/uL (ref 0.0–0.1)
EOS PCT: 2.8 % (ref 0.0–5.0)
Eosinophils Absolute: 0.2 10*3/uL (ref 0.0–0.7)
HEMATOCRIT: 45 % (ref 39.0–52.0)
Hemoglobin: 15.5 g/dL (ref 13.0–17.0)
LYMPHS ABS: 1.7 10*3/uL (ref 0.7–4.0)
Lymphocytes Relative: 24.4 % (ref 12.0–46.0)
MCHC: 34.5 g/dL (ref 30.0–36.0)
MCV: 91.4 fl (ref 78.0–100.0)
MONO ABS: 0.3 10*3/uL (ref 0.1–1.0)
Monocytes Relative: 4.1 % (ref 3.0–12.0)
Neutro Abs: 4.8 10*3/uL (ref 1.4–7.7)
Neutrophils Relative %: 68.4 % (ref 43.0–77.0)
Platelets: 141 10*3/uL — ABNORMAL LOW (ref 150.0–400.0)
RBC: 4.92 Mil/uL (ref 4.22–5.81)
RDW: 14 % (ref 11.5–15.5)
WBC: 7 10*3/uL (ref 4.0–10.5)

## 2013-09-30 ENCOUNTER — Ambulatory Visit: Payer: 59

## 2013-09-30 ENCOUNTER — Other Ambulatory Visit: Payer: Self-pay | Admitting: Family Medicine

## 2013-09-30 DIAGNOSIS — R7301 Impaired fasting glucose: Secondary | ICD-10-CM

## 2013-09-30 DIAGNOSIS — E785 Hyperlipidemia, unspecified: Secondary | ICD-10-CM

## 2013-09-30 LAB — HEMOGLOBIN A1C: Hgb A1c MFr Bld: 5.8 % (ref 4.6–6.5)

## 2013-09-30 NOTE — Progress Notes (Signed)
Noted. FLP ordered "future approx".

## 2013-12-16 ENCOUNTER — Encounter: Payer: Self-pay | Admitting: Family Medicine

## 2013-12-16 ENCOUNTER — Ambulatory Visit (INDEPENDENT_AMBULATORY_CARE_PROVIDER_SITE_OTHER): Payer: 59 | Admitting: Family Medicine

## 2013-12-16 VITALS — BP 118/83 | HR 59 | Temp 98.0°F | Resp 18 | Ht 71.5 in | Wt 185.0 lb

## 2013-12-16 DIAGNOSIS — L84 Corns and callosities: Secondary | ICD-10-CM

## 2013-12-16 NOTE — Progress Notes (Signed)
Pre visit review using our clinic review tool, if applicable. No additional management support is needed unless otherwise documented below in the visit note. 

## 2013-12-16 NOTE — Progress Notes (Signed)
OFFICE NOTE  12/16/2013  CC:  Chief Complaint  Patient presents with  . Toe Pain    ?callous, bottom of foot     HPI: Patient is a 58 y.o. Caucasian male who is here for pain on bottom of left foot, hard skin that he has been filing down.  One spot persists and is quite painful to walk on.  Pertinent PMH:  Past medical, surgical, social, and family history reviewed and no changes are noted since last office visit.  MEDS:  Outpatient Prescriptions Prior to Visit  Medication Sig Dispense Refill  . pantoprazole (PROTONIX) 40 MG tablet TAKE 1 TABLET BY MOUTH EVERY DAY 30 tablet 5   No facility-administered medications prior to visit.    PE: Blood pressure 118/83, pulse 59, temperature 98 F (36.7 C), temperature source Oral, resp. rate 18, height 5' 11.5" (1.816 m), weight 185 lb (83.915 kg), SpO2 99 %. Gen: Alert, well appearing.  Patient is oriented to person, place, time, and situation. Right foot, head of 5th metatarsal on plantar surface has a firm and mildly tender callus.  IMPRESSION AND PLAN:  Callus, plantar surface--causing intractable pain. I covered the area with betadine, used dermablade to shave the callus down completely. He felt significant relief--no more tenderness when the area was palpated.  No plantar wart was found underlying the callus.  Pt tolerated procedure well.  No immediate complications.  Covered area with band-aid.  An After Visit Summary was printed and given to the patient.  FOLLOW UP: prn

## 2014-02-14 ENCOUNTER — Other Ambulatory Visit: Payer: Self-pay | Admitting: Family Medicine

## 2014-02-14 MED ORDER — PANTOPRAZOLE SODIUM 40 MG PO TBEC: 40.0000 mg | DELAYED_RELEASE_TABLET | Freq: Every day | ORAL | Status: DC

## 2014-03-28 ENCOUNTER — Other Ambulatory Visit (INDEPENDENT_AMBULATORY_CARE_PROVIDER_SITE_OTHER): Payer: 59

## 2014-03-28 DIAGNOSIS — E785 Hyperlipidemia, unspecified: Secondary | ICD-10-CM

## 2014-03-28 LAB — LIPID PANEL
CHOLESTEROL: 191 mg/dL (ref 0–200)
HDL: 42.3 mg/dL (ref >39.00)
LDL CALC: 133 mg/dL — AB (ref 0–99)
NonHDL: 148.7
TRIGLYCERIDES: 81 mg/dL (ref 0.0–149.0)
Total CHOL/HDL Ratio: 5
VLDL: 16.2 mg/dL (ref 0.0–40.0)

## 2014-07-05 ENCOUNTER — Other Ambulatory Visit: Payer: Self-pay | Admitting: *Deleted

## 2014-07-05 MED ORDER — PANTOPRAZOLE SODIUM 40 MG PO TBEC: 40.0000 mg | DELAYED_RELEASE_TABLET | Freq: Every day | ORAL | Status: DC

## 2014-07-05 NOTE — Telephone Encounter (Signed)
RF request for Pantoprazole. LOV: 09/29/14  Next ov: 09/30/14 Last written: 02/14/14 #30 w/ 3RF

## 2014-09-30 ENCOUNTER — Ambulatory Visit (INDEPENDENT_AMBULATORY_CARE_PROVIDER_SITE_OTHER): Payer: 59 | Admitting: Family Medicine

## 2014-09-30 ENCOUNTER — Encounter: Payer: Self-pay | Admitting: Family Medicine

## 2014-09-30 VITALS — BP 114/77 | HR 105 | Temp 98.0°F | Resp 16 | Ht 71.5 in | Wt 178.0 lb

## 2014-09-30 DIAGNOSIS — Z0000 Encounter for general adult medical examination without abnormal findings: Secondary | ICD-10-CM | POA: Diagnosis not present

## 2014-09-30 DIAGNOSIS — Z125 Encounter for screening for malignant neoplasm of prostate: Secondary | ICD-10-CM | POA: Diagnosis not present

## 2014-09-30 LAB — LIPID PANEL
CHOLESTEROL: 179 mg/dL (ref 0–200)
HDL: 46.3 mg/dL (ref >39.00)
LDL CALC: 119 mg/dL — AB (ref 0–99)
NonHDL: 132.24
Total CHOL/HDL Ratio: 4
Triglycerides: 64 mg/dL (ref 0.0–149.0)
VLDL: 12.8 mg/dL (ref 0.0–40.0)

## 2014-09-30 LAB — COMPREHENSIVE METABOLIC PANEL
ALBUMIN: 4.3 g/dL (ref 3.5–5.2)
ALK PHOS: 52 U/L (ref 39–117)
ALT: 16 U/L (ref 0–53)
AST: 17 U/L (ref 0–37)
BILIRUBIN TOTAL: 0.7 mg/dL (ref 0.2–1.2)
BUN: 21 mg/dL (ref 6–23)
CALCIUM: 8.9 mg/dL (ref 8.4–10.5)
CHLORIDE: 104 meq/L (ref 96–112)
CO2: 29 mEq/L (ref 19–32)
CREATININE: 1.03 mg/dL (ref 0.40–1.50)
GFR: 77.91 mL/min (ref >60.00)
Glucose, Bld: 90 mg/dL (ref 70–99)
Potassium: 4 mEq/L (ref 3.5–5.1)
SODIUM: 139 meq/L (ref 135–145)
TOTAL PROTEIN: 6.4 g/dL (ref 6.0–8.3)

## 2014-09-30 LAB — CBC WITH DIFFERENTIAL/PLATELET
BASOS ABS: 0 10*3/uL (ref 0.0–0.1)
Basophils Relative: 0.2 % (ref 0.0–3.0)
EOS ABS: 0.1 10*3/uL (ref 0.0–0.7)
Eosinophils Relative: 1.8 % (ref 0.0–5.0)
HEMATOCRIT: 45.7 % (ref 39.0–52.0)
HEMOGLOBIN: 15.2 g/dL (ref 13.0–17.0)
LYMPHS PCT: 22.5 % (ref 12.0–46.0)
Lymphs Abs: 1.5 10*3/uL (ref 0.7–4.0)
MCHC: 33.2 g/dL (ref 30.0–36.0)
MCV: 91.9 fl (ref 78.0–100.0)
MONO ABS: 0.4 10*3/uL (ref 0.1–1.0)
Monocytes Relative: 5.2 % (ref 3.0–12.0)
Neutro Abs: 4.8 10*3/uL (ref 1.4–7.7)
Neutrophils Relative %: 70.3 % (ref 43.0–77.0)
Platelets: 157 10*3/uL (ref 150.0–400.0)
RBC: 4.97 Mil/uL (ref 4.22–5.81)
RDW: 14.5 % (ref 11.5–15.5)
WBC: 6.8 10*3/uL (ref 4.0–10.5)

## 2014-09-30 LAB — PSA: PSA: 2.1 ng/mL (ref 0.10–4.00)

## 2014-09-30 LAB — TSH: TSH: 1.04 u[IU]/mL (ref 0.35–4.50)

## 2014-09-30 NOTE — Progress Notes (Signed)
Office Note 09/30/2014  CC:  Chief Complaint  Patient presents with  . Annual Exam    Pt is fasting.    HPI:  Brandon Nunez is a 59 y.o. White male who is here for annual health maintenance exam. No complaints.     Past Medical History  Diagnosis Date  . GERD (gastroesophageal reflux disease)   . Low back pain, episodic     MRI unremarkable at orthopedist's 2010/05/20  . Osteoarthritis of right knee     Past Surgical History  Procedure Laterality Date  . Hemorrhoid surgery  approx 1998  . Colonoscopy  approx May 19, 2008    Normal pet pt: was told rpt 10 yrs  . Nasal sinus surgery      Family History  Problem Relation Age of Onset  . Cancer Mother     skin (unknown type per pt)  . Cancer Sister     lung (+smoker)    Social History   Social History  . Marital Status: Married    Spouse Name: N/A  . Number of Children: N/A  . Years of Education: N/A   Occupational History  . Not on file.   Social History Main Topics  . Smoking status: Current Some Day Smoker    Types: Cigars  . Smokeless tobacco: Never Used  . Alcohol Use: Yes     Comment: wine, whiskey daily  . Drug Use: No  . Sexual Activity: Not on file   Other Topics Concern  . Not on file   Social History Narrative   Remarried (1st wife died of breast cancer) 05/20/2006, has 1 biologic child and 2 stepchildren.   He is one of 11 kids, born in [place], moved around to many different countries with parents as he grew up.   Relocated to [facility] 2011/12 from [place].   Occupation: Art gallery manager, Engineer, mining.   Used to be a long distance runner, still works out regularly.   Smokes occasional cigar, drinks one glass of wine and one whisky drink nightly.   No hx of drug use.             Outpatient Prescriptions Prior to Visit  Medication Sig Dispense Refill  . pantoprazole (PROTONIX) 40 MG tablet Take 1 tablet (40 mg total) by mouth daily. 30 tablet 3   No facility-administered medications prior to visit.     No Known Allergies  ROS Review of Systems  Constitutional: Negative for fever, chills, appetite change and fatigue.  HENT: Negative for congestion, dental problem, ear pain and sore throat.   Eyes: Negative for discharge, redness and visual disturbance.  Respiratory: Negative for cough, chest tightness, shortness of breath and wheezing.   Cardiovascular: Negative for chest pain, palpitations and leg swelling.  Gastrointestinal: Negative for nausea, vomiting, abdominal pain, diarrhea and blood in stool.  Genitourinary: Negative for dysuria, urgency, frequency, hematuria, flank pain and difficulty urinating.  Musculoskeletal: Negative for myalgias, back pain, joint swelling, arthralgias and neck stiffness.  Skin: Negative for pallor and rash.  Neurological: Negative for dizziness, speech difficulty, weakness and headaches.  Hematological: Negative for adenopathy. Does not bruise/bleed easily.  Psychiatric/Behavioral: Negative for confusion and sleep disturbance. The patient is not nervous/anxious.     PE; Blood pressure 114/77, pulse 105, temperature 98 F (36.7 C), temperature source Oral, resp. rate 16, height 5' 11.5" (1.816 m), weight 178 lb (80.74 kg), SpO2 96 %. Gen: Alert, well appearing.  Patient is oriented to person, place, time, and situation. AFFECT: pleasant, lucid thought  and speech. ENT: Ears: EACs clear, normal epithelium.  TMs with good light reflex and landmarks bilaterally.  Eyes: no injection, icteris, swelling, or exudate.  EOMI, PERRLA. Nose: no drainage or turbinate edema/swelling.  No injection or focal lesion.  Mouth: lips without lesion/swelling.  Oral mucosa pink and moist.  Dentition intact and without obvious caries or gingival swelling.  Oropharynx without erythema, exudate, or swelling.  Neck: supple/nontender.  No LAD, mass, or TM.  Carotid pulses 2+ bilaterally, without bruits. CV: RRR, no m/r/g.   LUNGS: CTA bilat, nonlabored resps, good aeration in  all lung fields. ABD: soft, NT, ND, BS normal.  No hepatospenomegaly or mass.  No bruits. EXT: no clubbing, cyanosis, or edema.  Musculoskeletal: no joint swelling, erythema, warmth, or tenderness.  ROM of all joints intact. Skin - no sores or suspicious lesions or rashes or color changes Rectal exam: negative without mass, lesions or tenderness, PROSTATE EXAM: smooth and symmetric without nodules or tenderness.   Pertinent labs:  Lab Results  Component Value Date   TSH 1.51 09/28/2013   Lab Results  Component Value Date   WBC 7.0 09/28/2013   HGB 15.5 09/28/2013   HCT 45.0 09/28/2013   MCV 91.4 09/28/2013   PLT 141.0 Repeated and verified X2.* 09/28/2013   Lab Results  Component Value Date   CREATININE 1.1 09/28/2013   BUN 14 09/28/2013   NA 138 09/28/2013   K 4.0 09/28/2013   CL 105 09/28/2013   CO2 27 09/28/2013   Lab Results  Component Value Date   ALT 21 09/28/2013   AST 24 09/28/2013   ALKPHOS 51 09/28/2013   BILITOT 0.8 09/28/2013   Lab Results  Component Value Date   CHOL 191 03/28/2014   Lab Results  Component Value Date   HDL 42.30 03/28/2014   Lab Results  Component Value Date   LDLCALC 133* 03/28/2014   Lab Results  Component Value Date   TRIG 81.0 03/28/2014   Lab Results  Component Value Date   CHOLHDL 5 03/28/2014   Lab Results  Component Value Date   PSA 1.78 09/28/2013   PSA 1.56 08/20/2012   PSA 1.85 04/08/2011    ASSESSMENT AND PLAN:   Health maintenance exam: Reviewed age and gender appropriate health maintenance issues (prudent diet, regular exercise, health risks of tobacco and excessive alcohol, use of seatbelts, fire alarms in home, use of sunscreen).  Also reviewed age and gender appropriate health screening as well as vaccine recommendations. He declined flu vaccine.  He declined HIV and Hep C screening. HP labs + PSA drawn today. DRE normal today. Next colonoscopy due 2020.  An After Visit Summary was printed and  given to the patient.  FOLLOW UP:  Return in about 1 year (around 09/30/2015) for annual CPE (fasting).

## 2014-09-30 NOTE — Progress Notes (Signed)
Pre visit review using our clinic review tool, if applicable. No additional management support is needed unless otherwise documented below in the visit note. 

## 2014-11-23 ENCOUNTER — Other Ambulatory Visit: Payer: Self-pay | Admitting: *Deleted

## 2014-11-23 MED ORDER — PANTOPRAZOLE SODIUM 40 MG PO TBEC: 40.0000 mg | DELAYED_RELEASE_TABLET | Freq: Every day | ORAL | Status: DC

## 2014-11-23 NOTE — Telephone Encounter (Signed)
RF request for pantoprazole LOV: 09/30/14 Next ov: None Last written: 07/05/14 #30 w/ 3RF

## 2015-04-12 ENCOUNTER — Ambulatory Visit (INDEPENDENT_AMBULATORY_CARE_PROVIDER_SITE_OTHER): Payer: 59 | Admitting: Family Medicine

## 2015-04-12 ENCOUNTER — Encounter: Payer: Self-pay | Admitting: Family Medicine

## 2015-04-12 VITALS — BP 111/75 | HR 61 | Temp 98.1°F | Resp 16 | Ht 71.5 in | Wt 186.0 lb

## 2015-04-12 DIAGNOSIS — G5781 Other specified mononeuropathies of right lower limb: Secondary | ICD-10-CM

## 2015-04-12 DIAGNOSIS — G5761 Lesion of plantar nerve, right lower limb: Secondary | ICD-10-CM

## 2015-04-12 NOTE — Progress Notes (Signed)
OFFICE VISIT  04/12/2015   CC:  Chief Complaint  Patient presents with  . Foot Pain    right 3rd digit x 2-3 weeks     HPI:    Patient is a 60 y.o. Caucasian male who presents for pain in right foot between 2nd and 3rd toes in the area of the metatarsal heads.  No swelling, redness, or heat to the area.  No hx of injury or overuse.  Denies numbness or tingling in foot or toes.  Past Medical History  Diagnosis Date  . GERD (gastroesophageal reflux disease)   . Low back pain, episodic     MRI unremarkable at orthopedist's 2012  . Osteoarthritis of right knee     Past Surgical History  Procedure Laterality Date  . Hemorrhoid surgery  approx 1998  . Colonoscopy  approx 2010    Normal pet pt: was told rpt 10 yrs  . Nasal sinus surgery      Outpatient Prescriptions Prior to Visit  Medication Sig Dispense Refill  . pantoprazole (PROTONIX) 40 MG tablet Take 1 tablet (40 mg total) by mouth daily. 30 tablet 11   No facility-administered medications prior to visit.    No Known Allergies  ROS As per HPI  PE: Blood pressure 111/75, pulse 61, temperature 98.1 F (36.7 C), temperature source Oral, resp. rate 16, height 5' 11.5" (1.816 m), weight 186 lb (84.369 kg), SpO2 96 %. Gen: Alert, well appearing.  Patient is oriented to person, place, time, and situation. Right foot: no erythema, swelling, or deformity.  No rash.  No excessive warmth. Web space between 2nd and 3rd toes is TTP focally but I cannot feel any mass.  ROM of foot and toes is intact.  LABS:  none  IMPRESSION AND PLAN:  Interdigital neuroma: discussed dx with pt. Instructions: Wear wide-toed shoes. Insert metatarsal pads into shoe so that the pressure is taken off of the area of most pain.  An After Visit Summary was printed and given to the patient.  FOLLOW UP: Return if symptoms worsen or fail to improve.  Give this conservative mgmt 2 wks and if not improved then call or return.  Signed:  Santiago BumpersPhil  Amreen Raczkowski, MD           04/12/2015

## 2015-04-12 NOTE — Progress Notes (Signed)
Pre visit review using our clinic review tool, if applicable. No additional management support is needed unless otherwise documented below in the visit note. 

## 2015-04-12 NOTE — Patient Instructions (Signed)
Wear wide-toed shoes. Insert metatarsal pads into shoe so that the pressure is taken off of the area of most pain.

## 2015-11-22 ENCOUNTER — Encounter: Payer: Self-pay | Admitting: Family Medicine

## 2015-11-22 ENCOUNTER — Ambulatory Visit (INDEPENDENT_AMBULATORY_CARE_PROVIDER_SITE_OTHER): Payer: 59 | Admitting: Family Medicine

## 2015-11-22 VITALS — BP 131/90 | HR 62 | Temp 97.2°F | Resp 16 | Ht 71.5 in | Wt 184.8 lb

## 2015-11-22 DIAGNOSIS — Z125 Encounter for screening for malignant neoplasm of prostate: Secondary | ICD-10-CM | POA: Diagnosis not present

## 2015-11-22 DIAGNOSIS — Z0000 Encounter for general adult medical examination without abnormal findings: Secondary | ICD-10-CM | POA: Diagnosis not present

## 2015-11-22 DIAGNOSIS — E785 Hyperlipidemia, unspecified: Secondary | ICD-10-CM

## 2015-11-22 HISTORY — DX: Hyperlipidemia, unspecified: E78.5

## 2015-11-22 LAB — CBC WITH DIFFERENTIAL/PLATELET
Basophils Absolute: 0 10*3/uL (ref 0.0–0.1)
Basophils Relative: 0.4 % (ref 0.0–3.0)
EOS PCT: 1.4 % (ref 0.0–5.0)
Eosinophils Absolute: 0.1 10*3/uL (ref 0.0–0.7)
HCT: 44.2 % (ref 39.0–52.0)
HEMOGLOBIN: 15.2 g/dL (ref 13.0–17.0)
LYMPHS PCT: 24.1 % (ref 12.0–46.0)
Lymphs Abs: 2 10*3/uL (ref 0.7–4.0)
MCHC: 34.4 g/dL (ref 30.0–36.0)
MCV: 89.7 fl (ref 78.0–100.0)
MONOS PCT: 5 % (ref 3.0–12.0)
Monocytes Absolute: 0.4 10*3/uL (ref 0.1–1.0)
Neutro Abs: 5.8 10*3/uL (ref 1.4–7.7)
Neutrophils Relative %: 69.1 % (ref 43.0–77.0)
Platelets: 173 10*3/uL (ref 150.0–400.0)
RBC: 4.93 Mil/uL (ref 4.22–5.81)
RDW: 13.7 % (ref 11.5–15.5)
WBC: 8.4 10*3/uL (ref 4.0–10.5)

## 2015-11-22 LAB — COMPREHENSIVE METABOLIC PANEL
ALK PHOS: 49 U/L (ref 39–117)
ALT: 16 U/L (ref 0–53)
AST: 16 U/L (ref 0–37)
Albumin: 4.5 g/dL (ref 3.5–5.2)
BUN: 15 mg/dL (ref 6–23)
CALCIUM: 9.3 mg/dL (ref 8.4–10.5)
CO2: 29 meq/L (ref 19–32)
Chloride: 105 mEq/L (ref 96–112)
Creatinine, Ser: 0.94 mg/dL (ref 0.40–1.50)
GFR: 86.25 mL/min (ref >60.00)
GLUCOSE: 95 mg/dL (ref 70–99)
POTASSIUM: 4 meq/L (ref 3.5–5.1)
Sodium: 140 mEq/L (ref 135–145)
TOTAL PROTEIN: 6.7 g/dL (ref 6.0–8.3)
Total Bilirubin: 0.7 mg/dL (ref 0.2–1.2)

## 2015-11-22 LAB — LIPID PANEL
CHOL/HDL RATIO: 4
Cholesterol: 206 mg/dL — ABNORMAL HIGH (ref 0–200)
HDL: 48.2 mg/dL (ref >39.00)
LDL Cholesterol: 143 mg/dL — ABNORMAL HIGH (ref 0–99)
NONHDL: 158.02
Triglycerides: 76 mg/dL (ref 0.0–149.0)
VLDL: 15.2 mg/dL (ref 0.0–40.0)

## 2015-11-22 LAB — PSA: PSA: 3.78 ng/mL (ref 0.10–4.00)

## 2015-11-22 LAB — TSH: TSH: 1.03 u[IU]/mL (ref 0.35–4.50)

## 2015-11-22 NOTE — Progress Notes (Signed)
Office Note 11/22/2015  CC:  Chief Complaint  Patient presents with  . Annual Exam    CPE    HPI:  Brandon Nunez is a 60 y.o. White male who is here for annual health maintenance exam. Started smoking this year, unfortunately. Not exercising. Eye exam 1 mo ago. Dental UTD. Says he does drink alcohol: whiskey and beer, "probably too much". Working long hours.   Past Medical History:  Diagnosis Date  . GERD (gastroesophageal reflux disease)   . Low back pain, episodic    MRI unremarkable at orthopedist's 2012  . Osteoarthritis of right knee     Past Surgical History:  Procedure Laterality Date  . COLONOSCOPY  approx 2010   Normal pet pt: was told rpt 10 yrs  . HEMORRHOID SURGERY  approx 1998  . NASAL SINUS SURGERY      Family History  Problem Relation Age of Onset  . Cancer Mother     skin (unknown type per pt)  . Cancer Sister     lung (+smoker)    Social History   Social History  . Marital status: Married    Spouse name: N/A  . Number of children: N/A  . Years of education: N/A   Occupational History  . Not on file.   Social History Main Topics  . Smoking status: Current Some Day Smoker    Types: Cigars  . Smokeless tobacco: Never Used  . Alcohol use Yes     Comment: wine, whiskey daily  . Drug use: No  . Sexual activity: Not on file   Other Topics Concern  . Not on file   Social History Narrative   Remarried (1st wife died of breast cancer) 2008, has 1 biologic child and 2 stepchildren.   He is one of 11 kids, born in [place], moved around to many different countries with parents as he grew up.   [place]   Occupation: Art gallery managerngineer, Engineer, miningstudio design.   Used to be a long distance runner, still works out regularly.   Smokes occasional cigar, drinks one glass of wine and one whisky drink nightly.   No hx of drug use.             Outpatient Medications Prior to Visit  Medication Sig Dispense Refill  .  pantoprazole (PROTONIX) 40 MG tablet Take 1 tablet (40 mg total) by mouth daily. 30 tablet 11   No facility-administered medications prior to visit.     No Known Allergies  ROS Review of Systems  Constitutional: Negative for appetite change, chills, fatigue and fever.  HENT: Negative for congestion, dental problem, ear pain and sore throat.   Eyes: Negative for discharge, redness and visual disturbance.  Respiratory: Negative for cough, chest tightness, shortness of breath and wheezing.   Cardiovascular: Negative for chest pain, palpitations and leg swelling.  Gastrointestinal: Negative for abdominal pain, blood in stool, diarrhea, nausea and vomiting.  Genitourinary: Negative for difficulty urinating, dysuria, flank pain, frequency, hematuria and urgency.  Musculoskeletal: Negative for arthralgias, back pain, joint swelling, myalgias and neck stiffness.  Skin: Negative for pallor and rash.  Neurological: Negative for dizziness, speech difficulty, weakness and headaches.  Hematological: Negative for adenopathy. Does not bruise/bleed easily.  Psychiatric/Behavioral: Negative for confusion and sleep disturbance. The patient is not nervous/anxious.     PE; Blood pressure 131/90, pulse 62, temperature 97.2 F (36.2 C), temperature source Temporal, resp. rate 16, height 5' 11.5" (1.816 m), weight 184 lb 12.8 oz (  83.8 kg), SpO2 98 %. Gen: Alert, well appearing.  Patient is oriented to person, place, time, and situation. AFFECT: pleasant, lucid thought and speech. ENT: Ears: EACs clear, normal epithelium.  TMs with good light reflex and landmarks bilaterally.  Eyes: no injection, icteris, swelling, or exudate.  EOMI, PERRLA. Nose: no drainage or turbinate edema/swelling.  No injection or focal lesion.  Mouth: lips without lesion/swelling.  Oral mucosa pink and moist.  Dentition intact and without obvious caries or gingival swelling.  Oropharynx without erythema, exudate, or swelling.  Neck:  supple/nontender.  No LAD, mass, or TM.  Carotid pulses 2+ bilaterally, without bruits. CV: RRR, no m/r/g.   LUNGS: CTA bilat, nonlabored resps, good aeration in all lung fields. ABD: soft, NT, ND, BS normal.  No hepatospenomegaly or mass.  No bruits. EXT: no clubbing, cyanosis, or edema.  Musculoskeletal: no joint swelling, erythema, warmth, or tenderness.  ROM of all joints intact. Skin - no sores or suspicious lesions or rashes or color changes Rectal exam: negative without mass, lesions or tenderness, PROSTATE EXAM: smooth and symmetric without nodules or tenderness.   Pertinent labs:  Lab Results  Component Value Date   TSH 1.04 09/30/2014   Lab Results  Component Value Date   WBC 6.8 09/30/2014   HGB 15.2 09/30/2014   HCT 45.7 09/30/2014   MCV 91.9 09/30/2014   PLT 157.0 09/30/2014   Lab Results  Component Value Date   CREATININE 1.03 09/30/2014   BUN 21 09/30/2014   NA 139 09/30/2014   K 4.0 09/30/2014   CL 104 09/30/2014   CO2 29 09/30/2014   Lab Results  Component Value Date   ALT 16 09/30/2014   AST 17 09/30/2014   ALKPHOS 52 09/30/2014   BILITOT 0.7 09/30/2014   Lab Results  Component Value Date   CHOL 179 09/30/2014   Lab Results  Component Value Date   HDL 46.30 09/30/2014   Lab Results  Component Value Date   LDLCALC 119 (H) 09/30/2014   Lab Results  Component Value Date   TRIG 64.0 09/30/2014   Lab Results  Component Value Date   CHOLHDL 4 09/30/2014   Lab Results  Component Value Date   PSA 2.10 09/30/2014   PSA 1.78 09/28/2013   PSA 1.56 08/20/2012   Lab Results  Component Value Date   HGBA1C 5.8 09/30/2013   ASSESSMENT AND PLAN:   Health maintenance exam: Reviewed age and gender appropriate health maintenance issues (prudent diet, regular exercise, health risks of tobacco and excessive alcohol, use of seatbelts, fire alarms in home, use of sunscreen).  Also reviewed age and gender appropriate health screening as well as vaccine  recommendations. He declined flu vaccine today. Fasting HP labs drawn today. Colon ca screening: next colonoscopy due 2020. Prostate ca screening: DRE normal today, PSA drawn.  An After Visit Summary was printed and given to the patient.  FOLLOW UP:  Return in about 1 year (around 11/21/2016) for annual CPE (fasting).  Signed:  Santiago BumpersPhil Tiernan Suto, MD           11/22/2015

## 2015-11-22 NOTE — Progress Notes (Signed)
Pre visit review using our clinic review tool, if applicable. No additional management support is needed unless otherwise documented below in the visit note. 

## 2015-11-23 ENCOUNTER — Encounter: Payer: Self-pay | Admitting: Family Medicine

## 2015-11-23 ENCOUNTER — Other Ambulatory Visit: Payer: Self-pay | Admitting: Family Medicine

## 2015-11-23 DIAGNOSIS — E7800 Pure hypercholesterolemia, unspecified: Secondary | ICD-10-CM

## 2015-12-04 ENCOUNTER — Other Ambulatory Visit: Payer: Self-pay | Admitting: *Deleted

## 2015-12-04 MED ORDER — PANTOPRAZOLE SODIUM 40 MG PO TBEC: 40.0000 mg | DELAYED_RELEASE_TABLET | Freq: Every day | ORAL | 11 refills | Status: DC

## 2015-12-04 NOTE — Telephone Encounter (Signed)
RF request for pantoprazole LOV: 11/22/15 Next ov: 05/22/16 Last written: 11/23/14 #30 w/ 11RF

## 2016-03-04 ENCOUNTER — Ambulatory Visit (INDEPENDENT_AMBULATORY_CARE_PROVIDER_SITE_OTHER): Payer: 59 | Admitting: Family Medicine

## 2016-03-04 ENCOUNTER — Encounter: Payer: Self-pay | Admitting: Family Medicine

## 2016-03-04 VITALS — BP 117/82 | HR 69 | Temp 98.5°F | Resp 16 | Ht 71.5 in | Wt 186.2 lb

## 2016-03-04 DIAGNOSIS — R42 Dizziness and giddiness: Secondary | ICD-10-CM | POA: Diagnosis not present

## 2016-03-04 LAB — CBC WITH DIFFERENTIAL/PLATELET
BASOS ABS: 0 10*3/uL (ref 0.0–0.1)
Basophils Relative: 0.6 % (ref 0.0–3.0)
EOS ABS: 0.1 10*3/uL (ref 0.0–0.7)
Eosinophils Relative: 1.6 % (ref 0.0–5.0)
HEMATOCRIT: 44.7 % (ref 39.0–52.0)
HEMOGLOBIN: 15.2 g/dL (ref 13.0–17.0)
LYMPHS PCT: 23 % (ref 12.0–46.0)
Lymphs Abs: 1.5 10*3/uL (ref 0.7–4.0)
MCHC: 34 g/dL (ref 30.0–36.0)
MCV: 90.8 fl (ref 78.0–100.0)
Monocytes Absolute: 0.4 10*3/uL (ref 0.1–1.0)
Monocytes Relative: 6.4 % (ref 3.0–12.0)
Neutro Abs: 4.3 10*3/uL (ref 1.4–7.7)
Neutrophils Relative %: 68.4 % (ref 43.0–77.0)
Platelets: 169 10*3/uL (ref 150.0–400.0)
RBC: 4.92 Mil/uL (ref 4.22–5.81)
RDW: 14.2 % (ref 11.5–15.5)
WBC: 6.3 10*3/uL (ref 4.0–10.5)

## 2016-03-04 LAB — COMPREHENSIVE METABOLIC PANEL
ALBUMIN: 4.5 g/dL (ref 3.5–5.2)
ALT: 17 U/L (ref 0–53)
AST: 23 U/L (ref 0–37)
Alkaline Phosphatase: 51 U/L (ref 39–117)
BILIRUBIN TOTAL: 0.5 mg/dL (ref 0.2–1.2)
BUN: 19 mg/dL (ref 6–23)
CALCIUM: 9.3 mg/dL (ref 8.4–10.5)
CHLORIDE: 106 meq/L (ref 96–112)
CO2: 26 mEq/L (ref 19–32)
CREATININE: 1.04 mg/dL (ref 0.40–1.50)
GFR: 76.68 mL/min (ref >60.00)
Glucose, Bld: 97 mg/dL (ref 70–99)
Potassium: 4.2 mEq/L (ref 3.5–5.1)
Sodium: 140 mEq/L (ref 135–145)
TOTAL PROTEIN: 6.5 g/dL (ref 6.0–8.3)

## 2016-03-04 NOTE — Patient Instructions (Signed)
Cut back alcohol intake to max of 1 drink per night. Start slowly getting back into exercise. Drink plenty of clear fluids.

## 2016-03-04 NOTE — Progress Notes (Signed)
OFFICE VISIT  03/04/2016   CC:  Chief Complaint  Patient presents with  . Dizziness    x 4 weeks   HPI:    Patient is a 61 y.o. Caucasian male who presents for lightheadedness x approx 4 weeks. Feels like it is continuous, nothing really triggers it.  Smoking makes it worse.  Nothing makes it better. No signif presyncope feeling.  Pretty continuous ears ringing but this is not new.   He has been working a lot of hours, started smoking again, stopped exercising.  No vertigo, no nausea. No palpitations, no heart racing.  Has a vague sense of breathlessness and feeling out of shape lately. No CP with exertion.  No diaphoresis.  No new otc meds lately.  Drinking 2-3 alcohol drinks per night on chronic basis---a little more lately than his usual.  ROS: excessive thirst and urination of late.  No legs pains or swelling.  He last ate this morning: approx 3 hours ago (banana and coffee with sugar and creamer).  Past Medical History:  Diagnosis Date  . GERD (gastroesophageal reflux disease)   . Hyperlipidemia 11/22/2015  . Low back pain, episodic    MRI unremarkable at orthopedist's 2012  . Osteoarthritis of right knee     Past Surgical History:  Procedure Laterality Date  . COLONOSCOPY  approx 2010   Normal pet pt: was told rpt 10 yrs  . HEMORRHOID SURGERY  approx 1998  . NASAL SINUS SURGERY      Outpatient Medications Prior to Visit  Medication Sig Dispense Refill  . pantoprazole (PROTONIX) 40 MG tablet Take 1 tablet (40 mg total) by mouth daily. 30 tablet 11   No facility-administered medications prior to visit.     No Known Allergies  ROS As per HPI  PE: Blood pressure 117/82, pulse 69, temperature 98.5 F (36.9 C), temperature source Oral, resp. rate 16, height 5' 11.5" (1.816 m), weight 186 lb 4 oz (84.5 kg), SpO2 96 %. Gen: Alert, well appearing.  Patient is oriented to person, place, time, and situation. AFFECT: pleasant, lucid thought and speech. ZOX:WRUE:  no injection, icteris, swelling, or exudate.  EOMI, PERRLA. Mouth: lips without lesion/swelling.  Oral mucosa pink and moist. Oropharynx without erythema, exudate, or swelling.  Neck: supple/nontender.  No LAD, mass, or TM.  Carotid pulses 2+ bilaterally, without bruits. CV: RRR, no m/r/g.   LUNGS: CTA bilat, nonlabored resps, good aeration in all lung fields. EXT: no clubbing, cyanosis, or edema.   LABS:   12 lead EKG today:  Sinus brady, rate 59, TWI in V1 and III.  No Q waves or ST segment abnormalities.  QRS duration and QT intervals normal.  PR interval normal.  No ectopy.  Low voltage V2.  No prior EKG for comparison.  Lab Results  Component Value Date   TSH 1.03 11/22/2015   Lab Results  Component Value Date   WBC 8.4 11/22/2015   HGB 15.2 11/22/2015   HCT 44.2 11/22/2015   MCV 89.7 11/22/2015   PLT 173.0 11/22/2015   Lab Results  Component Value Date   CREATININE 0.94 11/22/2015   BUN 15 11/22/2015   NA 140 11/22/2015   K 4.0 11/22/2015   CL 105 11/22/2015   CO2 29 11/22/2015   Lab Results  Component Value Date   ALT 16 11/22/2015   AST 16 11/22/2015   ALKPHOS 49 11/22/2015   BILITOT 0.7 11/22/2015   Lab Results  Component Value Date   CHOL 206 (H)  11/22/2015   Lab Results  Component Value Date   HDL 48.20 11/22/2015   Lab Results  Component Value Date   LDLCALC 143 (H) 11/22/2015   Lab Results  Component Value Date   TRIG 76.0 11/22/2015   Lab Results  Component Value Date   CHOLHDL 4 11/22/2015   Lab Results  Component Value Date   PSA 3.78 11/22/2015   PSA 2.10 09/30/2014   PSA 1.78 09/28/2013   Lab Results  Component Value Date   HGBA1C 5.8 09/30/2013   IMPRESSION AND PLAN:  Disequilibrium/lightheadedness: reassuring exam and EKG. Will check CBC and CMET today. His recent increased alcohol, restarting smoking, and stopping exercise, + stress at work could all be contributing. Recommended he cut back alcohol to max of 1 drink per  night. Slowly restart exercising.  Signs/symptoms to call or return for were reviewed and pt expressed understanding. Will consider Holter if not any improvement over the next 2 weeks.  An After Visit Summary was printed and given to the patient.  FOLLOW UP: Return in about 2 weeks (around 03/18/2016) for dizziness.  Signed:  Santiago BumpersPhil Lanecia Sliva, MD           03/04/2016

## 2016-03-04 NOTE — Progress Notes (Signed)
Pre visit review using our clinic review tool, if applicable. No additional management support is needed unless otherwise documented below in the visit note. 

## 2016-05-22 ENCOUNTER — Other Ambulatory Visit (INDEPENDENT_AMBULATORY_CARE_PROVIDER_SITE_OTHER): Payer: 59

## 2016-05-22 ENCOUNTER — Encounter: Payer: Self-pay | Admitting: Family Medicine

## 2016-05-22 DIAGNOSIS — E7800 Pure hypercholesterolemia, unspecified: Secondary | ICD-10-CM

## 2016-05-22 LAB — LIPID PANEL
CHOLESTEROL: 216 mg/dL — AB (ref 0–200)
HDL: 50.5 mg/dL (ref >39.00)
LDL CALC: 147 mg/dL — AB (ref 0–99)
NonHDL: 165.27
Total CHOL/HDL Ratio: 4
Triglycerides: 91 mg/dL (ref 0.0–149.0)
VLDL: 18.2 mg/dL (ref 0.0–40.0)

## 2016-05-23 ENCOUNTER — Other Ambulatory Visit: Payer: Self-pay | Admitting: Family Medicine

## 2016-05-23 ENCOUNTER — Encounter: Payer: Self-pay | Admitting: Family Medicine

## 2016-05-23 MED ORDER — ATORVASTATIN CALCIUM 20 MG PO TABS: 20.0000 mg | ORAL_TABLET | Freq: Every day | ORAL | 3 refills | Status: DC

## 2016-07-26 ENCOUNTER — Ambulatory Visit (INDEPENDENT_AMBULATORY_CARE_PROVIDER_SITE_OTHER): Payer: 59 | Admitting: Family Medicine

## 2016-07-26 ENCOUNTER — Encounter: Payer: Self-pay | Admitting: Family Medicine

## 2016-07-26 VITALS — BP 104/67 | HR 72 | Temp 98.2°F | Resp 16 | Ht 71.5 in | Wt 186.0 lb

## 2016-07-26 DIAGNOSIS — R42 Dizziness and giddiness: Secondary | ICD-10-CM | POA: Diagnosis not present

## 2016-07-26 DIAGNOSIS — F439 Reaction to severe stress, unspecified: Secondary | ICD-10-CM

## 2016-07-26 DIAGNOSIS — L989 Disorder of the skin and subcutaneous tissue, unspecified: Secondary | ICD-10-CM

## 2016-07-26 NOTE — Progress Notes (Signed)
OFFICE VISIT  07/26/2016   CC:  Chief Complaint  Patient presents with  . Dizziness     HPI:    Patient is a 61 y.o. Caucasian male who presents for lightheadedness. Describes on/off periods of lightheadedness for the last 5-6 mo, happens a few days a week.  Today he felt this and checked his bp at work and it was 148/98, pulse in the 80s.  This is the first time he has correlated his sx's with a bp check. Occ feels like he may pass out but no syncope.  No palpitations, no CP or heaviness, no diaphoresis.  Happens at rest just as often as it happens in any position or ambulating.  This didn't start after starting any particular medication. He does note that he has this more when he feels really stressed.  He does not have the sx's when he is NOT stressed. No focal weakness, no dysarthria, dysphagia, or tremor.  No ataxia.  No HAs.  No vision or hearing abnormalities. Alcohol: drinks 2 whiskies per night and 1 beer, then drinks water or coke.  He does drink alcohol to calm his nerves. Also smokes to calm his nerves.  No exercise in quite a while. He recently did decrease his work load at his job and this has helped some.   No drug use.  He also has noted a small bump on forehead in hairline for the last 4 mo or so, says he scratches at it and it bleeds a little and scabs over.  A few similar smaller lesions are next to it.  He asks if he can return to the dermatologist to get these evaluated/possibly removed.  Past Medical History:  Diagnosis Date  . GERD (gastroesophageal reflux disease)   . Hyperlipidemia 11/22/2015   TLC x 55mo--then recheck lipids unchanged--atorva 20 qd started 05/2016.  Marland Kitchen Low back pain, episodic    MRI unremarkable at orthopedist's 2012  . Osteoarthritis of right knee     Past Surgical History:  Procedure Laterality Date  . COLONOSCOPY  approx 2010   Normal pet pt: was told rpt 10 yrs  . HEMORRHOID SURGERY  approx 1998  . NASAL SINUS SURGERY       Outpatient Medications Prior to Visit  Medication Sig Dispense Refill  . atorvastatin (LIPITOR) 20 MG tablet Take 1 tablet (20 mg total) by mouth daily. 30 tablet 3  . pantoprazole (PROTONIX) 40 MG tablet Take 1 tablet (40 mg total) by mouth daily. 30 tablet 11   No facility-administered medications prior to visit.     No Known Allergies  ROS As per HPI  PE: Blood pressure 104/67, pulse 72, temperature 98.2 F (36.8 C), temperature source Oral, resp. rate 16, height 5' 11.5" (1.816 m), weight 186 lb (84.4 kg), SpO2 94 %. Gen: Alert, well appearing.  Patient is oriented to person, place, time, and situation. AFFECT: pleasant, lucid thought and speech. GNF:AOZH: no injection, icteris, swelling, or exudate.  EOMI, PERRLA. Mouth: lips without lesion/swelling.  Oral mucosa pink and moist. Oropharynx without erythema, exudate, or swelling.  Neck - No masses or thyromegaly or limitation in range of motion CV: RRR, no m/r/g.   LUNGS: CTA bilat, nonlabored resps, good aeration in all lung fields. ABD: soft, NT/ND EXT: no clubbing, cyanosis, or edema.  Neuro: CN 2-12 intact bilaterally, strength 5/5 in proximal and distal upper extremities and lower extremities bilaterally. No tremor.  No disdiadochokinesis.  No ataxia.  Upper extremity and lower extremity DTRs symmetric.  No pronator drift. SKIN: a few pink barely palpable flat papules are noted on hairline of forehead, one with a scab.   LABS:    Chemistry      Component Value Date/Time   NA 140 03/04/2016 1135   K 4.2 03/04/2016 1135   CL 106 03/04/2016 1135   CO2 26 03/04/2016 1135   BUN 19 03/04/2016 1135   CREATININE 1.04 03/04/2016 1135   CREATININE 0.92 08/20/2012 1612      Component Value Date/Time   CALCIUM 9.3 03/04/2016 1135   ALKPHOS 51 03/04/2016 1135   AST 23 03/04/2016 1135   ALT 17 03/04/2016 1135   BILITOT 0.5 03/04/2016 1135     Lab Results  Component Value Date   WBC 6.3 03/04/2016   HGB 15.2  03/04/2016   HCT 44.7 03/04/2016   MCV 90.8 03/04/2016   PLT 169.0 03/04/2016   Lab Results  Component Value Date   TSH 1.03 11/22/2015   IMPRESSION AND PLAN:  1) Episodic disequilibrium/lightheadedness: stress induced. No sign/red flag for organic dz as cause.  He agrees with this and says he really wants to start cutting back on his alcohol intake, cut back on smoking, and restart his exercise regimen.  Some general labs done 02/2016 when he had same sx's were normal, as was an EKG. Signs/symptoms to call or return for were reviewed and pt expressed understanding.  2) Skin lesions, forehead.  Referred pt to [facility] surgery center for further eval/mgmt.  An After Visit Summary was printed and given to the patient.  FOLLOW UP: Return if symptoms worsen or fail to improve.  Signed:  Santiago BumpersPhil Iyah Laguna, MD           07/26/2016

## 2016-08-07 ENCOUNTER — Encounter: Payer: Self-pay | Admitting: Family Medicine

## 2016-09-23 ENCOUNTER — Other Ambulatory Visit: Payer: Self-pay | Admitting: Family Medicine

## 2016-12-17 ENCOUNTER — Other Ambulatory Visit: Payer: Self-pay | Admitting: Family Medicine

## 2017-01-14 DIAGNOSIS — M5412 Radiculopathy, cervical region: Secondary | ICD-10-CM

## 2017-01-14 DIAGNOSIS — M1711 Unilateral primary osteoarthritis, right knee: Secondary | ICD-10-CM

## 2017-01-14 HISTORY — DX: Unilateral primary osteoarthritis, right knee: M17.11

## 2017-01-14 HISTORY — DX: Radiculopathy, cervical region: M54.12

## 2017-01-20 ENCOUNTER — Other Ambulatory Visit: Payer: Self-pay | Admitting: Family Medicine

## 2017-01-22 NOTE — Telephone Encounter (Signed)
Left message for pt to call back.  Okay for [facility] to advise pt and schedule appointment.

## 2017-01-24 NOTE — Telephone Encounter (Signed)
Pt has apt on 02/11/17 at 8:15am

## 2017-02-11 ENCOUNTER — Ambulatory Visit (INDEPENDENT_AMBULATORY_CARE_PROVIDER_SITE_OTHER): Payer: Managed Care, Other (non HMO) | Admitting: Family Medicine

## 2017-02-11 ENCOUNTER — Encounter: Payer: Self-pay | Admitting: Family Medicine

## 2017-02-11 VITALS — BP 117/73 | HR 61 | Temp 98.6°F | Resp 16 | Ht 71.5 in | Wt 190.5 lb

## 2017-02-11 DIAGNOSIS — Z125 Encounter for screening for malignant neoplasm of prostate: Secondary | ICD-10-CM

## 2017-02-11 DIAGNOSIS — E7800 Pure hypercholesterolemia, unspecified: Secondary | ICD-10-CM | POA: Diagnosis not present

## 2017-02-11 DIAGNOSIS — Z0000 Encounter for general adult medical examination without abnormal findings: Secondary | ICD-10-CM | POA: Diagnosis not present

## 2017-02-11 LAB — CBC WITH DIFFERENTIAL/PLATELET
Basophils Absolute: 0 10*3/uL (ref 0.0–0.1)
Basophils Relative: 0.5 % (ref 0.0–3.0)
EOS ABS: 0.2 10*3/uL (ref 0.0–0.7)
EOS PCT: 2.1 % (ref 0.0–5.0)
HCT: 46.6 % (ref 39.0–52.0)
HEMOGLOBIN: 15.5 g/dL (ref 13.0–17.0)
Lymphocytes Relative: 27.6 % (ref 12.0–46.0)
Lymphs Abs: 2.1 10*3/uL (ref 0.7–4.0)
MCHC: 33.4 g/dL (ref 30.0–36.0)
MCV: 91.3 fl (ref 78.0–100.0)
MONO ABS: 0.4 10*3/uL (ref 0.1–1.0)
Monocytes Relative: 5.6 % (ref 3.0–12.0)
Neutro Abs: 4.8 10*3/uL (ref 1.4–7.7)
Neutrophils Relative %: 64.2 % (ref 43.0–77.0)
Platelets: 157 10*3/uL (ref 150.0–400.0)
RBC: 5.1 Mil/uL (ref 4.22–5.81)
RDW: 14.1 % (ref 11.5–15.5)
WBC: 7.5 10*3/uL (ref 4.0–10.5)

## 2017-02-11 LAB — COMPREHENSIVE METABOLIC PANEL
ALBUMIN: 4.3 g/dL (ref 3.5–5.2)
ALK PHOS: 60 U/L (ref 39–117)
ALT: 16 U/L (ref 0–53)
AST: 16 U/L (ref 0–37)
BUN: 17 mg/dL (ref 6–23)
CO2: 29 mEq/L (ref 19–32)
CREATININE: 1.14 mg/dL (ref 0.40–1.50)
Calcium: 9 mg/dL (ref 8.4–10.5)
Chloride: 106 mEq/L (ref 96–112)
GFR: 68.77 mL/min (ref >60.00)
Glucose, Bld: 107 mg/dL — ABNORMAL HIGH (ref 70–99)
Potassium: 3.9 mEq/L (ref 3.5–5.1)
Sodium: 136 mEq/L (ref 135–145)
TOTAL PROTEIN: 6.7 g/dL (ref 6.0–8.3)
Total Bilirubin: 0.8 mg/dL (ref 0.2–1.2)

## 2017-02-11 LAB — LIPID PANEL
CHOLESTEROL: 142 mg/dL (ref 0–200)
HDL: 43.8 mg/dL (ref >39.00)
LDL Cholesterol: 84 mg/dL (ref 0–99)
NonHDL: 98.44
Total CHOL/HDL Ratio: 3
Triglycerides: 72 mg/dL (ref 0.0–149.0)
VLDL: 14.4 mg/dL (ref 0.0–40.0)

## 2017-02-11 LAB — PSA: PSA: 3.77 ng/mL (ref 0.10–4.00)

## 2017-02-11 LAB — TSH: TSH: 2.66 u[IU]/mL (ref 0.35–4.50)

## 2017-02-11 NOTE — Progress Notes (Signed)
Office Note 02/11/2017  CC:  Chief Complaint  Patient presents with  . Annual Exam    Pt is fasting.    HPI:  Brandon Nunez is a 62 y.o. White male who is here for annual health maintenance exam.  Of note, we started pt on atorva 20mg  qd about 7 mo ago.  Eyes: due for exam. Dental: preventatives UTD. Exercise: none, has bilat (R>L) knee pains.  Back went out but this is back to normal.  Occ pain in L top of shoulder--? "pinched nerve". Diet: lots of veggies and fruits, avoids junk food and take out. Drinking a glass of wine per night. Tobacco: started smoking cigs lightly again about a week ago.  Past Medical History:  Diagnosis Date  . GERD (gastroesophageal reflux disease)   . Hyperlipidemia 11/22/2015   TLC x 91mo--then recheck lipids unchanged--atorva 20 qd started 05-30-2016.  Marland Kitchen Low back pain, episodic    MRI unremarkable at orthopedist's 31-May-2010  . Osteoarthritis of right knee     Past Surgical History:  Procedure Laterality Date  . COLONOSCOPY  approx 05-30-08   Normal pet pt: was told rpt 10 yrs  . HEMORRHOID SURGERY  approx 1998  . NASAL SINUS SURGERY      Family History  Problem Relation Age of Onset  . Cancer Mother        skin (unknown type per pt)  . Cancer Sister        lung (+smoker)    Social History   Socioeconomic History  . Marital status: Married    Spouse name: Not on file  . Number of children: Not on file  . Years of education: Not on file  . Highest education level: Not on file  Social Needs  . Financial resource strain: Not on file  . Food insecurity - worry: Not on file  . Food insecurity - inability: Not on file  . Transportation needs - medical: Not on file  . Transportation needs - non-medical: Not on file  Occupational History  . Not on file  Tobacco Use  . Smoking status: Current Every Day Smoker    Packs/day: 0.25    Years: 0.00    Pack years: 0.00    Types: Cigars, Cigarettes  . Smokeless tobacco: Never Used  Substance and  Sexual Activity  . Alcohol use: Yes    Comment: wine, whiskey daily  . Drug use: No  . Sexual activity: Not on file  Other Topics Concern  . Not on file  Social History Narrative   Remarried (1st wife died of breast cancer) 31-May-2006, has 1 biologic child and 2 stepchildren.   He is one of 11 kids, born in [place], moved around to many different countries with parents as he grew up.   Relocated to [facility] 2011/12 from [place].   Occupation: Art gallery manager, Engineer, mining.   Used to be a long distance runner, still works out regularly.   Smokes occasional cigar, drinks one glass of wine and one whisky drink nightly.   No hx of drug use.          Outpatient Medications Prior to Visit  Medication Sig Dispense Refill  . atorvastatin (LIPITOR) 20 MG tablet TAKE 1 TABLET BY MOUTH EVERY DAY 30 tablet 0  . ketoconazole (NIZORAL) 2 % shampoo APPLY TO CHEST 3-4 DAYS A WEEK, THEN WEEKLY AS NEEDED.  1  . pantoprazole (PROTONIX) 40 MG tablet TAKE 1 TABLET (40 MG TOTAL) BY MOUTH DAILY. 30 tablet 1  No facility-administered medications prior to visit.     No Known Allergies  ROS Review of Systems  Constitutional: Negative for appetite change, chills, fatigue and fever.  HENT: Negative for congestion, dental problem, ear pain and sore throat.   Eyes: Negative for discharge, redness and visual disturbance.  Respiratory: Negative for cough, chest tightness, shortness of breath and wheezing.   Cardiovascular: Negative for chest pain, palpitations and leg swelling.  Gastrointestinal: Negative for abdominal pain, blood in stool, diarrhea, nausea and vomiting.  Genitourinary: Negative for difficulty urinating, dysuria, flank pain, frequency, hematuria and urgency.  Musculoskeletal: Positive for arthralgias (knees; R>L--chronic/worsening x 1 yr). Negative for back pain, joint swelling, myalgias and neck stiffness.  Skin: Negative for pallor and rash.  Neurological: Negative for dizziness, speech difficulty,  weakness and headaches.  Hematological: Negative for adenopathy. Does not bruise/bleed easily.  Psychiatric/Behavioral: Negative for confusion and sleep disturbance. The patient is not nervous/anxious.     PE; Blood pressure 117/73, pulse 61, temperature 98.6 F (37 C), temperature source Oral, resp. rate 16, height 5' 11.5" (1.816 m), weight 190 lb 8 oz (86.4 kg), SpO2 95 %. Body mass index is 26.2 kg/m.  Gen: Alert, well appearing.  Patient is oriented to person, place, time, and situation. AFFECT: pleasant, lucid thought and speech. ENT: Ears: EACs clear, normal epithelium.  TMs with good light reflex and landmarks bilaterally.  Eyes: no injection, icteris, swelling, or exudate.  EOMI, PERRLA. Nose: no drainage or turbinate edema/swelling.  No injection or focal lesion.  Mouth: lips without lesion/swelling.  Oral mucosa pink and moist.  Dentition intact and without obvious caries or gingival swelling.  Oropharynx without erythema, exudate, or swelling.  Neck: supple/nontender.  No LAD, mass, or TM.  Carotid pulses 2+ bilaterally, without bruits. CV: RRR, no m/r/g.   LUNGS: CTA bilat, nonlabored resps, good aeration in all lung fields. ABD: soft, NT, ND, BS normal.  No hepatospenomegaly or mass.  No bruits. EXT: no clubbing, cyanosis, or edema.  Musculoskeletal: no joint swelling, erythema, warmth, or tenderness.  ROM of all joints intact. Skin - no sores or suspicious lesions or rashes or color changes Rectal exam: negative without mass, lesions or tenderness, PROSTATE EXAM: smooth and symmetric without nodules or tenderness.   Pertinent labs:  Lab Results  Component Value Date   TSH 1.03 11/22/2015   Lab Results  Component Value Date   WBC 6.3 03/04/2016   HGB 15.2 03/04/2016   HCT 44.7 03/04/2016   MCV 90.8 03/04/2016   PLT 169.0 03/04/2016   Lab Results  Component Value Date   CREATININE 1.04 03/04/2016   BUN 19 03/04/2016   NA 140 03/04/2016   K 4.2 03/04/2016   CL  106 03/04/2016   CO2 26 03/04/2016   Lab Results  Component Value Date   ALT 17 03/04/2016   AST 23 03/04/2016   ALKPHOS 51 03/04/2016   BILITOT 0.5 03/04/2016   Lab Results  Component Value Date   CHOL 216 (H) 05/22/2016   Lab Results  Component Value Date   HDL 50.50 05/22/2016   Lab Results  Component Value Date   LDLCALC 147 (H) 05/22/2016   Lab Results  Component Value Date   TRIG 91.0 05/22/2016   Lab Results  Component Value Date   CHOLHDL 4 05/22/2016   Lab Results  Component Value Date   PSA 3.78 11/22/2015   PSA 2.10 09/30/2014   PSA 1.78 09/28/2013   Lab Results  Component Value Date  HGBA1C 5.8 09/30/2013    ASSESSMENT AND PLAN:   Health maintenance exam: Reviewed age and gender appropriate health maintenance issues (prudent diet, regular exercise, health risks of tobacco and excessive alcohol, use of seatbelts, fire alarms in home, use of sunscreen).  Also reviewed age and gender appropriate health screening as well as vaccine recommendations. Vaccines: Tdap UTD.  Flu vaccine-- pt declined today.   Shingrix--pt wants to think about it. Labs: Fasting HP, PSA. Prostate ca screening:  DRE normal , PSA. Colon ca screening: next colonoscopy due 2020.  Hyperlipidemia: tolerating atorva 20 mg qd.  Recheck FLP today--adjust statin dose as appropriate.  An After Visit Summary was printed and given to the patient.  FOLLOW UP:  Return for Make appt at your convenience to discuss knee pains.  Signed:  Santiago Bumpers, MD           02/11/2017

## 2017-02-11 NOTE — Patient Instructions (Signed)

## 2017-02-17 ENCOUNTER — Other Ambulatory Visit: Payer: Self-pay | Admitting: Family Medicine

## 2017-02-25 ENCOUNTER — Encounter: Payer: Self-pay | Admitting: Family Medicine

## 2017-02-25 ENCOUNTER — Ambulatory Visit: Payer: Managed Care, Other (non HMO) | Admitting: Family Medicine

## 2017-02-25 VITALS — BP 104/75 | HR 66 | Temp 97.8°F | Resp 16 | Ht 71.5 in | Wt 192.8 lb

## 2017-02-25 DIAGNOSIS — M25561 Pain in right knee: Secondary | ICD-10-CM | POA: Diagnosis not present

## 2017-02-25 DIAGNOSIS — M2351 Chronic instability of knee, right knee: Secondary | ICD-10-CM | POA: Diagnosis not present

## 2017-02-25 DIAGNOSIS — S83001A Unspecified subluxation of right patella, initial encounter: Secondary | ICD-10-CM | POA: Diagnosis not present

## 2017-02-25 NOTE — Progress Notes (Signed)
OFFICE VISIT  02/25/2017   CC:  Chief Complaint  Patient presents with  . Knee Pain    mostly the right knee    HPI:    Patient is a 62 y.o. Caucasian male who presents for knee pain. Right knee hurts when standing up from sitting position and gives way, but only when he has knee turned in a bit.  No pain with walking or sitting.  No knees swelling or redness.   Left knee: occ twinge of pain but o/w asymptomatic.  No definite known injury in the past. Hx of lots of running/rugby in the past but stopped all this about 15-20 yrs ago.  Back in 04/2011 he had acute R knee pain and I ended up doing steroid injection into knee.  Pertinent imaging: 05/06/11:   RIGHT KNEE - COMPLETE 4+ VIEW  Comparison: None Findings: There is no joint effusion. No fracture or subluxation identified.  There is sharpening tibial spines, medial compartment narrowing and marginal spur formation. Chondrocalcinosis is identified.  IMPRESSION: 1.  Tricompartment osteoarthritis with chondrocalcinosis.   Past Medical History:  Diagnosis Date  . GERD (gastroesophageal reflux disease)   . Hyperlipidemia 11/22/2015   TLC x 6039mo--then recheck lipids unchanged--atorva 20 qd started 05/2016.  Marland Kitchen. Low back pain, episodic    MRI unremarkable at orthopedist's 2012  . Osteoarthritis of right knee     Past Surgical History:  Procedure Laterality Date  . COLONOSCOPY  approx 2010   Normal pet pt: was told rpt 10 yrs  . HEMORRHOID SURGERY  approx 1998  . NASAL SINUS SURGERY      Outpatient Medications Prior to Visit  Medication Sig Dispense Refill  . atorvastatin (LIPITOR) 20 MG tablet TAKE 1 TABLET BY MOUTH EVERY DAY 30 tablet 0  . ketoconazole (NIZORAL) 2 % shampoo APPLY TO CHEST 3-4 DAYS A WEEK, THEN WEEKLY AS NEEDED.  1  . pantoprazole (PROTONIX) 40 MG tablet TAKE 1 TABLET (40 MG TOTAL) BY MOUTH DAILY. 30 tablet 0   No facility-administered medications prior to visit.     No Known  Allergies  ROS As per HPI  PE: Blood pressure 104/75, pulse 66, temperature 97.8 F (36.6 C), temperature source Oral, resp. rate 16, height 5' 11.5" (1.816 m), weight 192 lb 12 oz (87.4 kg), SpO2 95 %. Gen: Alert, well appearing.  Patient is oriented to person, place, time, and situation. AFFECT: pleasant, lucid thought and speech. Knees: no swelling, erythema, or warmth. ROM fully intact bilat, w/out any instability.  No pain with lachman's or varus/valgus stress on knees. No tenderness to palpation of knees. With R knee in full flexion he has a bit of pain in area just medial to patella. Patella a bit hypermobile on R compared to L. No joint line tenderness.  McMurray's neg bilat.    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 He has a knee sleeve he'll wear and he knows home strengthening exercises to do for this. Has some arthritis on exam a few years ago but this current problem does NOT sound like any manifestation of osteoarthritis.  An After Visit Summary was printed and given to the patient.  FOLLOW UP: Return if symptoms worsen or fail to improve.  Signed:  Santiago BumpersPhil Beauty Pless, MD           02/25/2017

## 2017-04-19 ENCOUNTER — Other Ambulatory Visit: Payer: Self-pay | Admitting: Family Medicine

## 2017-06-05 ENCOUNTER — Encounter: Payer: Self-pay | Admitting: Family Medicine

## 2017-06-05 ENCOUNTER — Ambulatory Visit: Payer: Managed Care, Other (non HMO) | Admitting: Family Medicine

## 2017-06-05 VITALS — BP 110/72 | HR 67 | Temp 98.4°F | Resp 16 | Ht 71.5 in | Wt 192.5 lb

## 2017-06-05 DIAGNOSIS — M25561 Pain in right knee: Secondary | ICD-10-CM

## 2017-06-05 DIAGNOSIS — S83241A Other tear of medial meniscus, current injury, right knee, initial encounter: Secondary | ICD-10-CM | POA: Diagnosis not present

## 2017-06-05 MED ORDER — METHYLPREDNISOLONE ACETATE 80 MG/ML IJ SUSP
80.0000 mg | Freq: Once | INTRAMUSCULAR | Status: AC
  Administered 2017-06-05: 80 mg via INTRAMUSCULAR

## 2017-06-05 NOTE — Progress Notes (Signed)
OFFICE VISIT  06/05/2017   CC:  Chief Complaint  Patient presents with  . Knee Pain    Right x 4 days   HPI:    Patient is a 62 y.o. Caucasian male with a hx of R knee osteoarthritis who presents for right knee pain. Onset 5 d/a after playing golf.  He did have his knee "give way" walking up a hill a few times. Hurting bad, constantly, has not been able to sleep for 4 nights. Pain located on medial aspect of knee, pt points to pes anserine bursa region. There is also pain around L side of patella.  No erythema or generalized or focal knee swelling. Twisting/varus/valgus forces make it worse.  WEaring a knee sleeve and this has helped. Has taken occ ibuprofen last couple days and no help at all.  Past Medical History:  Diagnosis Date  . GERD (gastroesophageal reflux disease)   . Hyperlipidemia 11/22/2015   TLC x 45mo--then recheck lipids unchanged--atorva 20 qd started 05/2016.  Marland Kitchen Low back pain, episodic    MRI unremarkable at orthopedist's 2012  . Osteoarthritis of right knee     Past Surgical History:  Procedure Laterality Date  . COLONOSCOPY  approx 2010   Normal pet pt: was told rpt 10 yrs  . HEMORRHOID SURGERY  approx 1998  . NASAL SINUS SURGERY      Outpatient Medications Prior to Visit  Medication Sig Dispense Refill  . atorvastatin (LIPITOR) 20 MG tablet TAKE 1 TABLET BY MOUTH EVERY DAY 30 tablet 5  . fluorouracil (EFUDEX) 5 % cream 1 APPLICATION APPLY ON THE SKIN NIGHTLY UP TO 3 WEEKS AS TOLERATED.  0  . ketoconazole (NIZORAL) 2 % shampoo APPLY TO CHEST 3-4 DAYS A WEEK, THEN WEEKLY AS NEEDED.  1  . pantoprazole (PROTONIX) 40 MG tablet TAKE 1 TABLET (40 MG TOTAL) BY MOUTH DAILY. 30 tablet 5   No facility-administered medications prior to visit.     No Known Allergies  ROS As per HPI  PE: Blood pressure 110/72, pulse 67, temperature 98.4 F (36.9 C), temperature source Oral, resp. rate 16, height 5' 11.5" (1.816 m), weight 192 lb 8 oz (87.3 kg), SpO2 97  %. Gen: Alert, well appearing.  Patient is oriented to person, place, time, and situation. AFFECT: pleasant, lucid thought and speech. R knee; No erythema, excessive warmth, or swelling/effusion. No swelling of pes anserine bursa.  Neg patellar grind.  NO signif excessive patellar subluxation. No peripatellar soft tissue pain.  Lachmann's neg.  Stress on medial collateral ligament elicits the pain in medial aspect of knee.  He has minimal TTP over medial joint line--pt says "you wouldn't have been able to do that to me yesterday".  Pain in medial joint line with R knee in full flexion.  McMurray's negative.   No thigh or calf pain.  No edema.  LABS:  None today  IMPRESSION AND PLAN:  Acute R knee pain, constant and severe. Occurred after twist/buckle injury walking up a hill playing golf 5 d/a. Question medial meniscus tear vs medial collateral ligament strain. Given intensity of the pain, more suspicion of meniscal tear. Plan: steroid injection in R knee today. Ibuprofen 600 mg tid with food x 10d. Sports med MD referral for further evaluation and management.   Procedure: Therapeutic knee injection.  The patient's clinical condition is marked by substantial pain and/or significant functional disability.  Other conservative therapy has not provided relief, is contraindicated, or not appropriate.  There is a reasonable  likelihood that injection will significantly improve the patient's pain and/or functional disability. Cleaned skin with alcohol swab, used anterior approach to enter knee joint, Injected 1 ml of /ml depo medrol + 2  ml of 1% plain lidocaine into joint space without resistance.  No immediate complications.  Patient tolerated procedure well.  Post-injection care discussed, including 20 min of icing 1-2 times in the next 4-8 hours, frequent non weight-bearing ROM exercises over the next few days, and general pain medication management.  An After Visit Summary was printed and  given to the patient.  FOLLOW UP: Return if symptoms worsen or fail to improve.  Signed:  Santiago Bumpers, MD           06/05/2017

## 2017-06-10 ENCOUNTER — Encounter: Payer: Self-pay | Admitting: Sports Medicine

## 2017-06-10 ENCOUNTER — Ambulatory Visit: Payer: Managed Care, Other (non HMO) | Admitting: Sports Medicine

## 2017-06-10 ENCOUNTER — Ambulatory Visit: Payer: Self-pay

## 2017-06-10 VITALS — BP 110/60 | HR 67 | Ht 72.0 in | Wt 190.0 lb

## 2017-06-10 DIAGNOSIS — M25561 Pain in right knee: Secondary | ICD-10-CM

## 2017-06-10 DIAGNOSIS — S83241A Other tear of medial meniscus, current injury, right knee, initial encounter: Secondary | ICD-10-CM | POA: Diagnosis not present

## 2017-06-10 MED ORDER — DICLOFENAC SODIUM 2 % TD SOLN: 1.0000 "application " | Freq: Two times a day (BID) | TRANSDERMAL | 0 refills | Status: AC

## 2017-06-10 MED ORDER — DICLOFENAC SODIUM 2 % TD SOLN: 1.0000 "application " | Freq: Two times a day (BID) | TRANSDERMAL | 2 refills | Status: DC

## 2017-06-10 NOTE — Procedures (Signed)
LIMITED MSK ULTRASOUND OF Right knee Images were obtained and interpreted by myself, Gaspar Bidding, DO  Images have been saved and stored to PACS system. Images obtained on: GE S7 Ultrasound machine  FINDINGS:  Patella & Patellar Tendon: Normal Quad & Quad Tendon: Normal Suprapatellar Pouch: Normal, small moderate effusion Medial Joint Line: Horizontal split within the meniscus Lateral Joint Line: Positive mushroom sign with no appreciable splitting Trochlea: n/a Posterior knee: n/a   IMPRESSION:  1. Medial meniscal tear with small supraphysiologic effusion

## 2017-06-10 NOTE — Patient Instructions (Signed)
[facility] instructions for Duexis, Pennsaid and Vimovo:  Your prescription will be filled through a mail order pharmacy.  It is typically [facility] but may vary depending on where you live.  You will receive a phone call from them which will typically come from a [phone] phone number.  You must speak directly to them to have this medication filled.  When the pharmacy calls, they will need your mailing address (for overnight shipment of the medication) andy they will need payment information if you have a copay (typically no more than $10). If you have not heard from them 2-3 days after your appointment with Dr. Rigby, contact us at the office ([phone]) or through MyChart so we can reach back out to the pharmacy.  

## 2017-06-10 NOTE — Progress Notes (Signed)
Brandon Nunez. [facility] at [facility] [phone]  Brandon Nunez - 62 y.o. male MRN [id number]  Date of birth: 04/09/1955  Visit Date: 06/10/2017  PCP: Jeoffrey Massed, MD   Referred by: Jeoffrey Massed, MD  Scribe for today's visit: Domenick Bookbinder     SUBJECTIVE:  Brandon Nunez is here for Initial Assessment (R knee pain) .  Referred by: Dr. Milinda Cave His R knee pain symptoms INITIALLY: Began about 5 days after golfing. Described as moderate constant pain, radiating to the medial aspect of the knee.  Worsened with twisting. Improved with compression.  Additional associated symptoms include: He has noticed that his knee will give out on him when doing up hill.     At this time symptoms show no change compared to onset. He has been taking IBU with no relief. He has used compression with some relief. He had steroid injection with PCP 06/05/2017.  ROS Denies night time disturbances. Denies fevers, chills, or night sweats. Denies unexplained weight loss. Denies personal history of cancer. Denies changes in bowel or bladder habits. Denies recent unreported falls. Reports new or worsening dyspnea or wheezing. Reports headaches or dizziness.  Denies numbness, tingling or weakness  In the extremities.  Denies dizziness or presyncopal episodes Denies lower extremity edema    HISTORY & PERTINENT PRIOR DATA:  Prior History reviewed and updated per electronic medical record.  Significant/pertinent history, findings, studies include:  reports that he has been smoking cigars and cigarettes.  He has been smoking about 0.25 packs per day for the past 0.00 years. He has never used smokeless tobacco. No results for input(s): HGBA1C, LABURIC, CREATINE in the last 8760 hours. No specialty comments available. No problems updated.  OBJECTIVE:  VS:  HT:6' (182.9 cm)   WT:190 lb (86.2 kg)  BMI:25.76    BP:110/60  HR:67bpm  TEMP: ( )   RESP:97 %   PHYSICAL EXAM: Constitutional: WDWN, Non-toxic appearing. Psychiatric: Alert & appropriately interactive.  Not depressed or anxious appearing. Respiratory: No increased work of breathing.  Trachea Midline Eyes: Pupils are equal.  EOM intact without nystagmus.  No scleral icterus  Vascular Exam: warm to touch no edema  lower extremity neuro exam: unremarkable normal strength normal sensation  MSK Exam: Right knee is overall well aligned.  Small amount of generalized medial joint line pain.  Mild pain with McMurray's.  No appreciable effusion on exam but generalized synovitis.  Full flexion and extension.   ASSESSMENT & PLAN:   1. Acute pain of right knee   2. Acute medial meniscus tear of right knee, initial encounter     PLAN: Discussed multiple options with him today including possibility of repeating intra-articular injection but he would like to hold off on this at this time.  Topical Pennsaid provided and he will plan to follow-up in 3 weeks to ensure clinical improvement.  If any lack of improvement at that time intra-articular injection will be further discussed versus further diagnostic evaluation with MRI.Marland Kitchen  Discussed appropriate avoidance activities.  Follow-up: Return in about 3 weeks (around 07/01/2017).        Please see additional documentation for Objective, Assessment and Plan sections. Pertinent additional documentation may be included in corresponding procedure notes, imaging studies, problem based documentation and patient instructions. Please see these sections of the encounter for additional information regarding this visit.  CMA/ATC served as Neurosurgeon during this visit. History, Physical, and Plan performed by medical provider. Documentation and  orders reviewed and attested to.      Gerda Diss, [facility] Physician

## 2017-06-16 ENCOUNTER — Encounter: Payer: Self-pay | Admitting: Sports Medicine

## 2017-07-01 ENCOUNTER — Ambulatory Visit: Payer: Managed Care, Other (non HMO) | Admitting: Sports Medicine

## 2017-07-01 ENCOUNTER — Encounter: Payer: Self-pay | Admitting: Sports Medicine

## 2017-07-01 VITALS — BP 122/84 | HR 68 | Ht 72.0 in | Wt 191.2 lb

## 2017-07-01 DIAGNOSIS — M9901 Segmental and somatic dysfunction of cervical region: Secondary | ICD-10-CM

## 2017-07-01 DIAGNOSIS — M25561 Pain in right knee: Secondary | ICD-10-CM | POA: Diagnosis not present

## 2017-07-01 DIAGNOSIS — M9902 Segmental and somatic dysfunction of thoracic region: Secondary | ICD-10-CM

## 2017-07-01 DIAGNOSIS — M542 Cervicalgia: Secondary | ICD-10-CM | POA: Diagnosis not present

## 2017-07-01 DIAGNOSIS — M9908 Segmental and somatic dysfunction of rib cage: Secondary | ICD-10-CM

## 2017-07-01 DIAGNOSIS — S83241D Other tear of medial meniscus, current injury, right knee, subsequent encounter: Secondary | ICD-10-CM

## 2017-07-01 NOTE — Patient Instructions (Signed)
Please perform the exercise program that we have prepared for you and gone over in detail on a daily basis.  In addition to the handout you were provided you can access your program through: [email or web address]   Your unique program code is: [email or web address]

## 2017-07-01 NOTE — Progress Notes (Signed)
Veverly FellsMichael D. Delorise Shinerigby, DO  [facility] [place] [phone]  Brandon NimrodJohn Hosie - 62 y.o. male MRN [id number]  Date of birth: Sep 13, 1955  Visit Date: 07/01/2017  PCP: Jeoffrey MassedMcGowen, Philip H, MD   Referred by: Jeoffrey MassedMcGowen, Philip H, MD  Scribe(s) for today's visit: Stevenson ClinchBrandy Coleman, CMA  SUBJECTIVE:  Brandon Nunez is here for Follow-up (R knee pain)   06/10/2017: His R knee pain symptoms INITIALLY: Began about 5 days after golfing. Described as moderate constant pain, radiating to the medial aspect of the knee.  Worsened with twisting. Improved with compression.  Additional associated symptoms include: He has noticed that his knee will give out on him when doing up hill.    At this time symptoms show no change compared to onset. He has been taking IBU with no relief. He has used compression with some relief. He had steroid injection with PCP 06/05/2017.  07/01/2017: Compared to the last office visit, his previously described symptoms are improving. He is able to sleep at night without much pain. He has minimal pain on the medial aspect of the knee with flexion.  Current symptoms are mild & are nonradiating He has been using Pennsaid with some relief. He has also been taking IBU with some relief. He wears compression when being active. He has been trying to rest the knee.    He also reports some upper thoracic pain that is nonradiating in nature and described more as a stiffness.  Does not awaken him at night.  Does sometimes interfere with his day-to-day activities.   REVIEW OF SYSTEMS: Denies night time disturbances. Denies fevers, chills, or night sweats. Denies unexplained weight loss. Denies personal history of cancer. Denies changes in bowel or bladder habits. Denies recent unreported falls. Denies new or worsening dyspnea or wheezing. Denies headaches or dizziness.  Denies numbness, tingling or weakness  In the extremities.  Denies dizziness or  presyncopal episodes Denies lower extremity edema    HISTORY & PERTINENT PRIOR DATA:  Prior History reviewed and updated per electronic medical record.  Significant/pertinent history, findings, studies include:  reports that he has been smoking cigars and cigarettes. He has been smoking about 0.25 packs per day for the past 0.00 years. He has never used smokeless tobacco. No results for input(s): HGBA1C, LABURIC, CREATINE in the last 8760 hours. No specialty comments available. No problems updated.  OBJECTIVE:  VS:  HT:6' (182.9 cm)   WT:191 lb 3.2 oz (86.7 kg)  BMI:25.93    BP:122/84  HR:68bpm  TEMP: ( )  RESP:97 %   PHYSICAL EXAM: CONSTITUTIONAL: Well-developed, Well-nourished and In no acute distress Alert & appropriately interactive. and Not depressed or anxious appearing. Respiratory: No increased work of breathing.  Trachea Midline EYES: Pupils are equal., EOM intact without nystagmus. and No scleral icterus.  Upper extremities: Warm and well perfused NEURO: unremarkable, Normal associated myotomal distribution strength to manual muscle testing, Normal sensation to light touch  MSK Exam: Neck: Overall well aligned without significant deformity.  Negative Spurling's compression test Lhermitte's compression test upper extremity neuro exam is normal.  No radicular symptoms.  Poor thoracic mobility.  Right knee No significant effusion today.  Ligamentously stable.  McMurray's is nonpainful.  No effusion.    PROCEDURES & DATA REVIEWED:  OMT per procedure note.  ASSESSMENT   1. Acute pain of right knee   2. Neck pain   3. Somatic dysfunction of cervical region   4. Somatic dysfunction of thoracic region  5. Somatic dysfunction of rib cage region   6. Acute medial meniscus tear of right knee, subsequent encounter     PLAN:  Osteopathic manipulation was performed today based on physical exam findings.  Please see procedure note for further information including  Osteopathic Exam findings  Discussed the foundation of treatment for this condition is physical therapy and/or daily (5-6 days/week) therapeutic exercises, focusing on core strengthening, coordination, neuromuscular control/reeducation.  Therapeutic exercises prescribed per procedure note.  His knee is doing remarkably better.  At this time he will follow-up if any worsening symptoms.  Follow-up: Return if symptoms worsen or fail to improve.      Please see additional documentation for Objective, Assessment and Plan sections. Pertinent additional documentation may be included in corresponding procedure notes, imaging studies, problem based documentation and patient instructions. Please see these sections of the encounter for additional information regarding this visit.  CMA/ATC served as Neurosurgeon during this visit. History, Physical, and Plan performed by medical provider. Documentation and orders reviewed and attested to.      Andrena Mews, DO    [facility]

## 2017-07-01 NOTE — Progress Notes (Signed)
PROCEDURE NOTE : OSTEOPATHIC MANIPULATION The decision today to treat with Osteopathic Manipulative Therapy (OMT) was based on physical exam findings. Verbal consent was obtained following a discussion with the patient regarding the of risks, benefits and potential side effects, including an acute pain flare,post manipulation soreness and need for repeat treatments.     Contraindications to OMT reviewed and include: NONE  Manipulation was performed as below: Regions treated: Cervical spine, Ribs and Thoracic spine OMT Techniques Used: HVLA, muscle energy and myofascial release  The patient tolerated the treatment well and reported Improved symptoms following treatment today. Patient was given medications, exercises, stretches and lifestyle modifications per AVS and verbally.   OSTEOPATHIC/STRUCTURAL EXAM:   C2 through C4 FRS left C4 FRS right T2 - T5 neutral side bent right, rotated left Ribs 6 posterior right

## 2017-07-01 NOTE — Progress Notes (Signed)

## 2017-07-13 ENCOUNTER — Encounter: Payer: Self-pay | Admitting: Family Medicine

## 2017-08-07 ENCOUNTER — Telehealth: Payer: Self-pay | Admitting: Family Medicine

## 2017-08-07 NOTE — Telephone Encounter (Signed)
Left message for pt to call back.   Need to have Chiropractor fax us his ov note and recommendation or have his office call with their recommendation.

## 2017-08-07 NOTE — Telephone Encounter (Signed)
Patient was seen by Dr. Karenann CaiLorczak with [facility]. Patient is having pain due to pinched nerve in C-5. The chiropractor recommended that patient get an antiinflammatory Rx from his PCP. Please advise.

## 2017-08-08 ENCOUNTER — Telehealth: Payer: Self-pay | Admitting: Family Medicine

## 2017-08-08 MED ORDER — PREDNISONE 20 MG PO TABS: ORAL_TABLET | ORAL | 0 refills | Status: DC

## 2017-08-08 MED ORDER — MELOXICAM 15 MG PO TABS: 15.0000 mg | ORAL_TABLET | Freq: Every day | ORAL | 0 refills | Status: DC

## 2017-08-08 NOTE — Telephone Encounter (Signed)
Called [facility] and [facility] for [facility]am.   Pt advised to p/u Rx for prednisone.

## 2017-08-08 NOTE — Telephone Encounter (Signed)
Pls call pt's pharmacy and cancel the meloxicam I rx'd earlier today. After talking to his chiropracter today I decided to send in prednisone instead. Still needs to f/u with me in 10-14d.-thx

## 2017-08-08 NOTE — Telephone Encounter (Signed)
OK, eRx'd meloxicam. Pt needs to f/u with me in 2-3 wks.-thx

## 2017-08-08 NOTE — Telephone Encounter (Signed)
Per CRM received today. Pt called back but [facility] was unable to reach office. Pt requested call back.  Will go ahead and send message to Dr. Milinda CaveMcGowen for review.

## 2017-08-08 NOTE — Telephone Encounter (Signed)
Pt advised and voiced understanding.   

## 2017-08-21 ENCOUNTER — Ambulatory Visit: Payer: Managed Care, Other (non HMO) | Admitting: Family Medicine

## 2017-08-21 ENCOUNTER — Encounter: Payer: Self-pay | Admitting: Family Medicine

## 2017-08-21 VITALS — BP 119/80 | HR 66 | Temp 98.3°F | Resp 16 | Ht 72.0 in | Wt 193.1 lb

## 2017-08-21 DIAGNOSIS — M5412 Radiculopathy, cervical region: Secondary | ICD-10-CM

## 2017-08-21 DIAGNOSIS — M542 Cervicalgia: Secondary | ICD-10-CM | POA: Diagnosis not present

## 2017-08-21 MED ORDER — HYDROCODONE-ACETAMINOPHEN 5-325 MG PO TABS: 1.0000 | ORAL_TABLET | Freq: Four times a day (QID) | ORAL | 0 refills | Status: AC | PRN

## 2017-08-21 MED ORDER — PREDNISONE 20 MG PO TABS: ORAL_TABLET | ORAL | 0 refills | Status: DC

## 2017-08-21 NOTE — Progress Notes (Signed)
OFFICE VISIT  08/25/2017   CC:  Chief Complaint  Patient presents with  . Back Pain   HPI:    Patient is a 62 y.o. Caucasian male who presents for neck pain with R arm radicular pain. Onset in the last 2 mo or so, no preceding acute injury/trauma.  Long hours at work sitting in front of computer/stressed.  Started in R side of neck, then began radiating across scapular region and down R arm. Tingling in R arrm.  No weakness. Has been seeing chiropractor, getting manipulations.  I also gave him a 10 day course of prednisone. However, he did not get this at pharmacy (but got the meloxicam that I had originally rx'd but decided to change to the prednisone). X-rays at chiroprator C5-6 disc space narrowing, o/w normal per pt.  Has disc with x-rays on it for me today but we are unable to access the files. He has not done PT.  UPDATE: NO IMPROVEMENT--BUT says this morning he woke up and it was [facility] better. He had worked a [employer] lifting this weekend. The pain is the worst when sitting or lying down.  Past Medical History:  Diagnosis Date  . GERD (gastroesophageal reflux disease)   . Hyperlipidemia 11/22/2015   TLC x 7769mo--then recheck lipids unchanged--atorva 20 qd started 05/2016.  Marland Kitchen. Low back pain, episodic    MRI unremarkable at orthopedist's 2012  . Osteoarthritis of right knee 2019   with medial meniscus tear--managing conservatively as of 06/2017 SM f/u.    Past Surgical History:  Procedure Laterality Date  . COLONOSCOPY  approx 2010   Normal pet pt: was told rpt 10 yrs  . HEMORRHOID SURGERY  approx 1998  . NASAL SINUS SURGERY      Outpatient Medications Prior to Visit  Medication Sig Dispense Refill  . atorvastatin (LIPITOR) 20 MG tablet TAKE 1 TABLET BY MOUTH EVERY DAY 30 tablet 5  . Diclofenac Sodium (PENNSAID) 2 % SOLN Place 1 application onto the skin 2 (two) times daily. 112 g 2  . fluorouracil (EFUDEX) 5 % cream 1 APPLICATION APPLY ON THE SKIN NIGHTLY UP TO 3 WEEKS AS  TOLERATED.  0  . pantoprazole (PROTONIX) 40 MG tablet TAKE 1 TABLET (40 MG TOTAL) BY MOUTH DAILY. 30 tablet 5  . meloxicam (MOBIC) 15 MG tablet Take 1 tablet (15 mg total) by mouth daily. 30 tablet 0  . ketoconazole (NIZORAL) 2 % shampoo APPLY TO CHEST 3-4 DAYS A WEEK, THEN WEEKLY AS NEEDED.  1  . predniSONE (DELTASONE) 20 MG tablet 2 tabs po qd x 5d, then 1 tab po qd x 5d (Patient not taking: Reported on 08/21/2017) 15 tablet 0   No facility-administered medications prior to visit.     No Known Allergies  ROS As per HPI  PE: Blood pressure 119/80, pulse 66, temperature 98.3 F (36.8 C), temperature source Oral, resp. rate 16, height 6' (1.829 m), weight 193 lb 2 oz (87.6 kg), SpO2 94 %. Gen: Alert, well appearing.  Patient is oriented to person, place, time, and situation. AFFECT: pleasant, lucid thought and speech. Neck: Extension, lateral bending bilat, and rotations all elicit R neck pain with radicular pain and paresthesia down R arm. TTP R upper thoracic paraspinous soft tissues.  No tenderness of shoulder girdle, biceps tendon, deltoid, or neck soft tissues.  Spurling's POS w/head turned to LEFT, but not right side.   DTRs: cannot elicit any DTRs on either UE.  Strength in UE's 5/5 prox and dist  bilat.   LABS:    Chemistry      Component Value Date/Time   NA 136 02/11/2017 0844   K 3.9 02/11/2017 0844   CL 106 02/11/2017 0844   CO2 29 02/11/2017 0844   BUN 17 02/11/2017 0844   CREATININE 1.14 02/11/2017 0844   CREATININE 0.92 08/20/2012 1612      Component Value Date/Time   CALCIUM 9.0 02/11/2017 0844   ALKPHOS 60 02/11/2017 0844   AST 16 02/11/2017 0844   ALT 16 02/11/2017 0844   BILITOT 0.8 02/11/2017 0844      IMPRESSION AND PLAN:  Acute R sided cervical pain with right arm radiculopathy. Stop meloxicam. Will do the prednisone taper I had originally rx'd--40mg  qd x 5d, then 20mg  qd x 5d.. Vicodin 5/325, 1-2 q6h prn, #42. Therapeutic expectations and side  effect profile of medication discussed today.  Patient's questions answered. Referral to PT.  An After Visit Summary was printed and given to the patient.  FOLLOW UP: Return in about 6 weeks (around 10/02/2017) for f/u neck pain/cerv radic.  Signed:  Santiago Bumpers, MD           08/25/2017

## 2017-08-31 ENCOUNTER — Encounter: Payer: Self-pay | Admitting: Sports Medicine

## 2017-10-02 ENCOUNTER — Encounter: Payer: Self-pay | Admitting: Family Medicine

## 2017-10-02 ENCOUNTER — Ambulatory Visit: Payer: Managed Care, Other (non HMO) | Admitting: Family Medicine

## 2017-10-02 VITALS — BP 112/72 | HR 64 | Temp 98.6°F | Resp 16 | Ht 72.0 in | Wt 190.0 lb

## 2017-10-02 DIAGNOSIS — M5412 Radiculopathy, cervical region: Secondary | ICD-10-CM

## 2017-10-02 NOTE — Progress Notes (Signed)
OFFICE VISIT  10/02/2017   CC:  Chief Complaint  Patient presents with  . Follow-up    Neck/Pain   HPI:    Patient is a 62 y.o. Caucasian male who presents for 6 wk f/u neck pain with R arm cervical radiculopathy. Last visit I had him stop meloxicam, I did a prednisone taper, short term vicodin tx, and referred him to PT.  Interim Hx: He did about 6 visits approx with minimal improvement.  He has a bit more neck flexibility. Pain still bad in neck radiating down R arm into fingers, paresthesias in same distribution. He has not been back to chiropractor. He still feels miserable, esp when sitting. Denies arm weakness.  No loss of b/b control.    Past Medical History:  Diagnosis Date  . GERD (gastroesophageal reflux disease)   . Hyperlipidemia 11/22/2015   TLC x 44mo--then recheck lipids unchanged--atorva 20 qd started 05/2016.  Marland Kitchen Low back pain, episodic    MRI unremarkable at orthopedist's 2012  . Osteoarthritis of right knee 2019   with medial meniscus tear--managing conservatively as of 06/2017 SM f/u.    Past Surgical History:  Procedure Laterality Date  . COLONOSCOPY  approx 2010   Normal pet pt: was told rpt 10 yrs  . HEMORRHOID SURGERY  approx 1998  . NASAL SINUS SURGERY      Outpatient Medications Prior to Visit  Medication Sig Dispense Refill  . atorvastatin (LIPITOR) 20 MG tablet TAKE 1 TABLET BY MOUTH EVERY DAY 30 tablet 5  . pantoprazole (PROTONIX) 40 MG tablet TAKE 1 TABLET (40 MG TOTAL) BY MOUTH DAILY. 30 tablet 5  . Diclofenac Sodium (PENNSAID) 2 % SOLN Place 1 application onto the skin 2 (two) times daily. (Patient not taking: Reported on 10/02/2017) 112 g 2  . fluorouracil (EFUDEX) 5 % cream 1 APPLICATION APPLY ON THE SKIN NIGHTLY UP TO 3 WEEKS AS TOLERATED.  0  . predniSONE (DELTASONE) 20 MG tablet 2 tabs po qd x 5d, then 1 tab po qd x 5d (Patient not taking: Reported on 10/02/2017) 15 tablet 0   No facility-administered medications prior to visit.      No Known Allergies  ROS As per HPI  PE: Blood pressure 112/72, pulse 64, temperature 98.6 F (37 C), temperature source Oral, resp. rate 16, height 6' (1.829 m), weight 190 lb (86.2 kg), SpO2 95 %. Gen: Alert, well appearing.  Patient is oriented to person, place, time, and situation. AFFECT: pleasant, lucid thought and speech. No tenderness of C spine, upper back, shoulders, or arms.  No impingement signs. ER and IR elicit mild pain in upper back mm's/tops of traps.  ABduction/adduction no pain. Speeds and Yergason's neg.  UE strength 5/5 prox/dist bilat. Spurling's is positive (L arm pain and tingling).  LABS:    Chemistry      Component Value Date/Time   NA 136 02/11/2017 0844   K 3.9 02/11/2017 0844   CL 106 02/11/2017 0844   CO2 29 02/11/2017 0844   BUN 17 02/11/2017 0844   CREATININE 1.14 02/11/2017 0844   CREATININE 0.92 08/20/2012 1612      Component Value Date/Time   CALCIUM 9.0 02/11/2017 0844   ALKPHOS 60 02/11/2017 0844   AST 16 02/11/2017 0844   ALT 16 02/11/2017 0844   BILITOT 0.8 02/11/2017 0844      IMPRESSION AND PLAN:  Cervical radiculopathy. Failed PT trial.  He got x-ray of neck in not-to-distant past at his chiroprator's and this was  reportedly normal. No new meds at this time. Will proceed with non contrast MRI of C spine.  An After Visit Summary was printed and given to the patient.  FOLLOW UP: Return for f/u to be determined based on results of imaging.  Signed:  Santiago BumpersPhil McGowen, MD           10/02/2017

## 2017-10-08 ENCOUNTER — Telehealth: Payer: Self-pay | Admitting: Family Medicine

## 2017-10-08 NOTE — Telephone Encounter (Signed)
Left message for pt to call back  °

## 2017-10-08 NOTE — Telephone Encounter (Signed)
His neck MRI got denied by [place]. Pls tell him I have a peer to peer review scheduled with [employer] at 4:30 today to try to get them to authorize it.-thx

## 2017-10-09 NOTE — Telephone Encounter (Signed)
Pt was advised yesterday afternoon (10/08/17), pt voiced understanding.

## 2017-10-15 ENCOUNTER — Telehealth: Payer: Self-pay | Admitting: Family Medicine

## 2017-10-15 MED ORDER — OXYCODONE HCL 5 MG PO TABS: ORAL_TABLET | ORAL | 0 refills | Status: DC

## 2017-10-15 NOTE — Telephone Encounter (Signed)
Patient is having MRI on 10/18/17. Can he get a Rx for pain killer for the procedure? It is very hard to lay flat without pain. He mentioned that pain killer he received in past did not work. Can he get something else?

## 2017-10-15 NOTE — Telephone Encounter (Signed)
Please advise. Thanks.  

## 2017-10-15 NOTE — Telephone Encounter (Signed)
OK, oxycodone eRx'd. 

## 2017-10-15 NOTE — Telephone Encounter (Signed)
Pt advised and voiced understanding.   

## 2017-10-18 ENCOUNTER — Ambulatory Visit (HOSPITAL_BASED_OUTPATIENT_CLINIC_OR_DEPARTMENT_OTHER): Payer: Managed Care, Other (non HMO)

## 2017-10-20 ENCOUNTER — Encounter: Payer: Self-pay | Admitting: Family Medicine

## 2017-10-20 ENCOUNTER — Telehealth: Payer: Self-pay | Admitting: Family Medicine

## 2017-10-20 NOTE — Telephone Encounter (Signed)
I called pt's cell # and LM to call back to office to discuss MRI neck results. Signed:  Santiago Bumpers, MD           10/20/2017

## 2017-10-21 ENCOUNTER — Other Ambulatory Visit: Payer: Self-pay | Admitting: Family Medicine

## 2017-10-21 DIAGNOSIS — M542 Cervicalgia: Secondary | ICD-10-CM

## 2017-10-21 DIAGNOSIS — M4802 Spinal stenosis, cervical region: Secondary | ICD-10-CM

## 2017-10-21 DIAGNOSIS — M5412 Radiculopathy, cervical region: Secondary | ICD-10-CM

## 2017-10-21 DIAGNOSIS — M4722 Other spondylosis with radiculopathy, cervical region: Secondary | ICD-10-CM

## 2017-10-21 DIAGNOSIS — G8929 Other chronic pain: Principal | ICD-10-CM

## 2017-10-21 NOTE — Telephone Encounter (Signed)
Spoke with pt about MRI neck results. Needs to see neurosurgeon. Will refer now. He declined any further pain meds at this time (although he says he is in a LOT of pain still).  He is wary of any potential dependency on opioids.

## 2017-10-21 NOTE — Telephone Encounter (Signed)
Patient is returning call from Dr. Marvel Plan regarding MRI results.  Please call patient back at [phone]

## 2017-10-30 ENCOUNTER — Other Ambulatory Visit: Payer: Self-pay | Admitting: Family Medicine

## 2017-10-31 ENCOUNTER — Other Ambulatory Visit: Payer: Self-pay | Admitting: Neurological Surgery

## 2017-10-31 DIAGNOSIS — M5412 Radiculopathy, cervical region: Secondary | ICD-10-CM

## 2017-11-02 ENCOUNTER — Other Ambulatory Visit: Payer: Self-pay | Admitting: Family Medicine

## 2017-11-12 ENCOUNTER — Ambulatory Visit
Admission: RE | Admit: 2017-11-12 | Discharge: 2017-11-12 | Disposition: A | Discharge status: Home / Self Care | Payer: Managed Care, Other (non HMO) | Source: Ambulatory Visit | Attending: Neurological Surgery | Admitting: Neurological Surgery

## 2017-11-12 DIAGNOSIS — M5412 Radiculopathy, cervical region: Secondary | ICD-10-CM

## 2017-11-12 MED ORDER — IOPAMIDOL (ISOVUE-M 300) INJECTION 61%
1.0000 mL | Freq: Once | INTRAMUSCULAR | Status: AC | PRN
  Administered 2017-11-12: 1 mL via EPIDURAL

## 2017-11-12 MED ORDER — TRIAMCINOLONE ACETONIDE 40 MG/ML IJ SUSP (RADIOLOGY)
60.0000 mg | Freq: Once | INTRAMUSCULAR | Status: AC
  Administered 2017-11-12: 60 mg via EPIDURAL

## 2017-11-12 NOTE — Discharge Instructions (Signed)

## 2017-12-04 ENCOUNTER — Encounter: Payer: Self-pay | Admitting: Family Medicine

## 2017-12-05 ENCOUNTER — Other Ambulatory Visit: Payer: Self-pay | Admitting: Neurological Surgery

## 2017-12-08 NOTE — Pre-Procedure Instructions (Signed)
Brandon ShanJohn Allain Michel Nunez  12/08/2017      [id number] [place] - [address] [place] [address] Phone: [phone] Fax: [phone]  [facility] [place] [address] [place] [id number] Phone: [phone] Fax: [phone]    Your procedure is scheduled on Monday December 2nd.  [facility]  Call this number if you have problems the morning of surgery:  [phone]   Remember:  Do not eat or drink after midnight.    Take these medicines the morning of surgery with A SIP OF WATER   atorvastatin (LIPITOR)  oxyCODONE (OXY IR/ROXICODONE) if needed  pantoprazole (PROTONIX)   7 days prior to surgery STOP taking any Aspirin(unless otherwise instructed by your surgeon), Aleve, Naproxen, Ibuprofen, Motrin, Advil, Goody's, BC's, all herbal medications, fish oil, and all vitamins     Do not wear jewelry.  Do not wear lotions, powders, or colognes, or deodorant.  Men may shave face and neck.  Do not bring valuables to the hospital.  [facility] is not responsible for any belongings or valuables.  Contacts, dentures or bridgework may not be worn into surgery.  Leave your suitcase in the car.  After surgery it may be brought to your room.  For patients admitted to the hospital, discharge time will be determined by your treatment team.  Patients discharged the day of surgery will not be allowed to drive home.    [name]- Preparing For Surgery  Before surgery, you can play an important role. Because skin is not sterile, your skin needs to be as free of germs as possible. You can reduce the number of germs on your skin by washing with CHG (chlorahexidine gluconate) Soap before surgery.  CHG is an antiseptic cleaner which kills germs and bonds with the skin to continue killing germs even after washing.    Oral Hygiene is also important to  reduce your risk of infection.  Remember - BRUSH YOUR TEETH THE MORNING OF SURGERY WITH YOUR REGULAR TOOTHPASTE  Please do not use if you have an allergy to CHG or antibacterial soaps. If your skin becomes reddened/irritated stop using the CHG.  Do not shave (including legs and underarms) for at least 48 hours prior to first CHG shower. It is OK to shave your face.  Please follow these instructions carefully.   1. Shower the NIGHT BEFORE SURGERY and the MORNING OF SURGERY with CHG.   2. If you chose to wash your hair, wash your hair first as usual with your normal shampoo.  3. After you shampoo, rinse your hair and body thoroughly to remove the shampoo.  4. Use CHG as you would any other liquid soap. You can apply CHG directly to the skin and wash gently with a scrungie or a clean washcloth.   5. Apply the CHG Soap to your body ONLY FROM THE NECK DOWN.  Do not use on open wounds or open sores. Avoid contact with your eyes, ears, mouth and genitals (private parts). Wash Face and genitals (private parts)  with your normal soap.  6. Wash thoroughly, paying special attention to the area where your surgery will be performed.  7. Thoroughly rinse your body with warm water from the neck down.  8. DO NOT shower/wash with your normal soap after using and rinsing off the CHG Soap.  9. Pat yourself dry with a  CLEAN TOWEL.  10. Wear CLEAN PAJAMAS to bed the night before surgery, wear comfortable clothes the morning of surgery  11. Place CLEAN SHEETS on your bed the night of your first shower and DO NOT SLEEP WITH PETS.    Day of Surgery:  Do not apply any deodorants/lotions.  Please wear clean clothes to the hospital/surgery center.   Remember to brush your teeth WITH YOUR REGULAR TOOTHPASTE.    Please read over the following fact sheets that you were given.

## 2017-12-09 ENCOUNTER — Other Ambulatory Visit: Payer: Self-pay

## 2017-12-09 ENCOUNTER — Encounter (HOSPITAL_COMMUNITY)
Admission: RE | Admit: 2017-12-09 | Discharge: 2017-12-09 | Disposition: A | Discharge status: Home / Self Care | Payer: Managed Care, Other (non HMO) | Source: Ambulatory Visit | Attending: Neurological Surgery | Admitting: Neurological Surgery

## 2017-12-09 ENCOUNTER — Encounter (HOSPITAL_COMMUNITY): Payer: Self-pay

## 2017-12-09 DIAGNOSIS — Z01812 Encounter for preprocedural laboratory examination: Secondary | ICD-10-CM | POA: Insufficient documentation

## 2017-12-09 LAB — CBC
HCT: 45.1 % (ref 39.0–52.0)
Hemoglobin: 14.9 g/dL (ref 13.0–17.0)
MCH: 29.7 pg (ref 26.0–34.0)
MCHC: 33 g/dL (ref 30.0–36.0)
MCV: 90 fL (ref 80.0–100.0)
NRBC: 0 % (ref 0.0–0.2)
PLATELETS: 167 10*3/uL (ref 150–400)
RBC: 5.01 MIL/uL (ref 4.22–5.81)
RDW: 13.2 % (ref 11.5–15.5)
WBC: 6.9 10*3/uL (ref 4.0–10.5)

## 2017-12-09 LAB — BASIC METABOLIC PANEL
ANION GAP: 8 (ref 5–15)
BUN: 15 mg/dL (ref 8–23)
CALCIUM: 9 mg/dL (ref 8.9–10.3)
CO2: 26 mmol/L (ref 22–32)
Chloride: 107 mmol/L (ref 98–111)
Creatinine, Ser: 1.1 mg/dL (ref 0.61–1.24)
Glucose, Bld: 124 mg/dL — ABNORMAL HIGH (ref 70–99)
POTASSIUM: 4 mmol/L (ref 3.5–5.1)
Sodium: 141 mmol/L (ref 135–145)

## 2017-12-09 LAB — SURGICAL PCR SCREEN
MRSA, PCR: NEGATIVE
STAPHYLOCOCCUS AUREUS: NEGATIVE

## 2017-12-09 NOTE — Pre-Procedure Instructions (Addendum)
Delilah ShanJohn Allain Michel Branaman  12/09/2017    Your procedure is scheduled on Monday, December 15, 2017 at 7:30 AM.   Report to [facility] Entrance "A" Admitting Office at 5:30 AM.   Call this number if you have problems the morning of surgery: [phone]   Questions prior to day of surgery, please call [phone] between 8 & 4 PM.   Remember:  Do not eat or drink after midnight "Sunday, 12/14/17.  Take these medicines the morning of surgery with A SIP OF WATER: Atorvastatin (Lipitor), Pantoprazole (Protonix), Hydrocodone - if needed  Stop CoQ10-Vitamin E as of today prior to surgery. Do not use NSAIDS (Ibuprofen, Aleve, etc), Aspirin products (Goody's, BC Powder, etc) or Herbal medications prior to surgery.    Do not wear jewelry.  Do not wear lotions, powders, cologne or deodorant.  Men may shave face and neck.  Do not bring valuables to the hospital.  [facility] is not responsible for any belongings or valuables.  Contacts, dentures or bridgework may not be worn into surgery.  Leave your suitcase in the car.  After surgery it may be brought to your room.  For patients admitted to the hospital, discharge time will be determined by your treatment team.   - Preparing for Surgery  Before surgery, you can play an important role.  Because skin is not sterile, your skin needs to be as free of germs as possible.  You can reduce the number of germs on you skin by washing with CHG (chlorahexidine gluconate) soap before surgery.  CHG is an antiseptic cleaner which kills germs and bonds with the skin to continue killing germs even after washing.  Oral Hygiene is also important in reducing the risk of infection.  Remember to brush your teeth with your regular toothpaste the morning of surgery.  Please DO NOT use if you have an allergy to CHG or antibacterial soaps.  If your skin becomes reddened/irritated stop using the CHG and inform your nurse when you arrive at Short  Stay.  Do not shave (including legs and underarms) for at least 48 hours prior to the first CHG shower.  You may shave your face.  Please follow these instructions carefully:   1.  Shower with CHG Soap the night before surgery and the morning of Surgery.  2.  If you choose to wash your hair, wash your hair first as usual with your normal shampoo.  3.  After you shampoo, rinse your hair and body thoroughly to remove the shampoo. 4.  Use CHG as you would any other liquid soap.  You can apply chg directly to the skin and wash gently with a      scrungie or washcloth.           5.  Apply the CHG Soap to your body ONLY FROM THE NECK DOWN.   Do not use on open wounds or open sores. Avoid contact with your eyes, ears, mouth and genitals (private parts).  Wash genitals (private parts) with your normal soap.  6.  Wash thoroughly, paying special attention to the area where your surgery will be performed.  7.  Thoroughly rinse your body with warm water from the neck down.  8.  DO NOT shower/wash with your normal soap after using and rinsing off the CHG Soap.  9.  Pat yourself dry with a clean towel.            10" .  Wear clean pajamas.  11.  Place clean sheets on your bed the night of your first shower and do not sleep with pets.  Day of Surgery  Shower as above. Do not apply any lotions/deoderants the morning of surgery.   Please wear clean clothes to the hospital. Remember to brush your teeth with toothpaste.   Please read over the fact sheets that you were given.

## 2017-12-09 NOTE — Progress Notes (Signed)
Pt denies cardiac history, chest pain, HTN or Diabetes.

## 2017-12-09 NOTE — Pre-Procedure Instructions (Signed)
Brandon Nunez  12/09/2017    Your procedure is scheduled on Monday, December 15, 2017 at 7:30 AM.   Report to [facility] Entrance "A" Admitting Office at 5:30 AM.   Call this number if you have problems the morning of surgery: [phone]   Questions prior to day of surgery, please call [phone] between 8 & 4 PM.   Remember:  Do not eat or drink after midnight Sunday, 12/14/17.  Take these medicines the morning of surgery with A SIP OF WATER: Atorvastatin (Lipitor), Pantoprazole (Protonix), Hydrocodone - if needed  Stop CoQ10-Vitamin E as of today prior to surgery. Do not use NSAIDS (Ibuprofen, Aleve, etc), Aspirin products (Goody's, BC Powder, etc) or Herbal medications prior to surgery.    Do not wear jewelry, make-up or nail polish.  Do not wear lotions, powders, perfumes or deodorant.  Do not shave 48 hours prior to surgery.   Do not bring valuables to the hospital.  [facility] is not responsible for any belongings or valuables.  Contacts, dentures or bridgework may not be worn into surgery.  Leave your suitcase in the car.  After surgery it may be brought to your room.  For patients admitted to the hospital, discharge time will be determined by your treatment team.  [facility] - Preparing for Surgery  Before surgery, you can play an important role.  Because skin is not sterile, your skin needs to be as free of germs as possible.  You can reduce the number of germs on you skin by washing with CHG (chlorahexidine gluconate) soap before surgery.  CHG is an antiseptic cleaner which kills germs and bonds with the skin to continue killing germs even after washing.  Oral Hygiene is also important in reducing the risk of infection.  Remember to brush your teeth with your regular toothpaste the morning of surgery.  Please DO NOT use if you have an allergy to CHG or antibacterial soaps.  If your skin becomes reddened/irritated stop using the CHG and inform your  nurse when you arrive at Short Stay.  Do not shave (including legs and underarms) for at least 48 hours prior to the first CHG shower.  You may shave your face.  Please follow these instructions carefully:   1.  Shower with CHG Soap the night before surgery and the morning of Surgery.  2.  If you choose to wash your hair, wash your hair first as usual with your normal shampoo.  3.  After you shampoo, rinse your hair and body thoroughly to remove the shampoo. 4.  Use CHG as you would any other liquid soap.  You can apply chg directly to the skin and wash gently with a      scrungie or washcloth.           5.  Apply the CHG Soap to your body ONLY FROM THE NECK DOWN.   Do not use on open wounds or open sores. Avoid contact with your eyes, ears, mouth and genitals (private parts).  Wash genitals (private parts) with your normal soap.  6.  Wash thoroughly, paying special attention to the area where your surgery will be performed.  7.  Thoroughly rinse your body with warm water from the neck down.  8.  DO NOT shower/wash with your normal soap after using and rinsing off the CHG Soap.  9.  Pat yourself dry with a clean towel.  10.  Wear clean pajamas.            11.  Place clean sheets on your bed the night of your first shower and do not sleep with pets.  Day of Surgery  Shower as above. Do not apply any lotions/deoderants the morning of surgery.   Please wear clean clothes to the hospital. Remember to brush your teeth with toothpaste.   Please read over the fact sheets that you were given.

## 2017-12-14 NOTE — Anesthesia Preprocedure Evaluation (Addendum)
Anesthesia Evaluation  Patient identified by MRN, date of birth, ID band Patient awake    Reviewed: Allergy & Precautions, NPO status , Patient's Chart, lab work & pertinent test results  History of Anesthesia Complications Negative for: history of anesthetic complications  Airway Mallampati: II  TM Distance: >3 FB Neck ROM: Limited    Dental  (+) Dental Advisory Given, Teeth Intact   Pulmonary former smoker,   Possible sleep apnea    breath sounds clear to auscultation       Cardiovascular Exercise Tolerance: Good (-) anginanegative cardio ROS   Rhythm:Regular Rate:Normal     Neuro/Psych  Neuromuscular disease (cervical radiculopathy right C6 distribution) negative psych ROS   GI/Hepatic GERD  Medicated and Controlled,  Endo/Other  negative endocrine ROS  Renal/GU negative Renal ROS     Musculoskeletal  (+) Arthritis , Osteoarthritis,    Abdominal   Peds  Hematology negative hematology ROS (+)   Anesthesia Other Findings   Reproductive/Obstetrics                            Anesthesia Physical Anesthesia Plan  ASA: II  Anesthesia Plan: General   Post-op Pain Management:    Induction: Intravenous  PONV Risk Score and Plan: 3 and Treatment may vary due to age or medical condition, Ondansetron, Dexamethasone and Midazolam  Airway Management Planned: Oral ETT and Video Laryngoscope Planned  Additional Equipment: None  Intra-op Plan:   Post-operative Plan: Extubation in OR  Informed Consent: I have reviewed the patients History and Physical, chart, labs and discussed the procedure including the risks, benefits and alternatives for the proposed anesthesia with the patient or authorized representative who has indicated his/her understanding and acceptance.   Dental advisory given  Plan Discussed with: CRNA and Anesthesiologist  Anesthesia Plan Comments:         Anesthesia Quick Evaluation

## 2017-12-15 ENCOUNTER — Ambulatory Visit (HOSPITAL_COMMUNITY): Payer: Managed Care, Other (non HMO) | Admitting: Anesthesiology

## 2017-12-15 ENCOUNTER — Encounter (HOSPITAL_COMMUNITY)
Admission: RE | Disposition: A | Discharge status: Home / Self Care | Payer: Self-pay | Source: Ambulatory Visit | Attending: Neurological Surgery

## 2017-12-15 ENCOUNTER — Observation Stay (HOSPITAL_COMMUNITY): Payer: Managed Care, Other (non HMO)

## 2017-12-15 ENCOUNTER — Other Ambulatory Visit: Payer: Self-pay

## 2017-12-15 ENCOUNTER — Observation Stay (HOSPITAL_COMMUNITY)
Admission: RE | Admit: 2017-12-15 | Discharge: 2017-12-16 | Disposition: A | Discharge status: Home / Self Care | Payer: Managed Care, Other (non HMO) | Source: Ambulatory Visit | Attending: Neurological Surgery | Admitting: Neurological Surgery

## 2017-12-15 ENCOUNTER — Encounter (HOSPITAL_COMMUNITY): Payer: Self-pay

## 2017-12-15 DIAGNOSIS — M4802 Spinal stenosis, cervical region: Secondary | ICD-10-CM | POA: Diagnosis not present

## 2017-12-15 DIAGNOSIS — Z79899 Other long term (current) drug therapy: Secondary | ICD-10-CM | POA: Diagnosis not present

## 2017-12-15 DIAGNOSIS — Z87891 Personal history of nicotine dependence: Secondary | ICD-10-CM | POA: Diagnosis not present

## 2017-12-15 DIAGNOSIS — Z419 Encounter for procedure for purposes other than remedying health state, unspecified: Secondary | ICD-10-CM

## 2017-12-15 DIAGNOSIS — M5412 Radiculopathy, cervical region: Secondary | ICD-10-CM | POA: Diagnosis present

## 2017-12-15 DIAGNOSIS — K219 Gastro-esophageal reflux disease without esophagitis: Secondary | ICD-10-CM | POA: Insufficient documentation

## 2017-12-15 DIAGNOSIS — E785 Hyperlipidemia, unspecified: Secondary | ICD-10-CM | POA: Diagnosis not present

## 2017-12-15 DIAGNOSIS — M50122 Cervical disc disorder at C5-C6 level with radiculopathy: Secondary | ICD-10-CM | POA: Diagnosis present

## 2017-12-15 HISTORY — PX: ANTERIOR CERVICAL DECOMP/DISCECTOMY FUSION: SHX1161

## 2017-12-15 SURGERY — ANTERIOR CERVICAL DECOMPRESSION/DISCECTOMY FUSION 1 LEVEL
Anesthesia: General

## 2017-12-15 MED ORDER — OXYCODONE HCL 5 MG PO TABS
10.0000 mg | ORAL_TABLET | ORAL | Status: DC | PRN
  Administered 2017-12-15 – 2017-12-16 (×6): 10 mg via ORAL
  Filled 2017-12-15 (×6): qty 2

## 2017-12-15 MED ORDER — ONDANSETRON HCL 4 MG/2ML IJ SOLN: 4.0000 mg | Freq: Four times a day (QID) | INTRAMUSCULAR | Status: DC | PRN

## 2017-12-15 MED ORDER — ONDANSETRON HCL 4 MG/2ML IJ SOLN
INTRAMUSCULAR | Status: DC | PRN
  Administered 2017-12-15: 4 mg via INTRAVENOUS

## 2017-12-15 MED ORDER — ATORVASTATIN CALCIUM 10 MG PO TABS
20.0000 mg | ORAL_TABLET | Freq: Every day | ORAL | Status: DC
  Administered 2017-12-16: 20 mg via ORAL
  Filled 2017-12-15: qty 2

## 2017-12-15 MED ORDER — OXYCODONE HCL 5 MG PO TABS: 5.0000 mg | ORAL_TABLET | ORAL | Status: DC | PRN

## 2017-12-15 MED ORDER — GLYCOPYRROLATE PF 0.2 MG/ML IJ SOSY
PREFILLED_SYRINGE | INTRAMUSCULAR | Status: AC
  Filled 2017-12-15: qty 1

## 2017-12-15 MED ORDER — LACTATED RINGERS IV SOLN
INTRAVENOUS | Status: DC | PRN
  Administered 2017-12-15 (×2): via INTRAVENOUS

## 2017-12-15 MED ORDER — MIDAZOLAM HCL 2 MG/2ML IJ SOLN
INTRAMUSCULAR | Status: AC
  Filled 2017-12-15: qty 2

## 2017-12-15 MED ORDER — LIDOCAINE 2% (20 MG/ML) 5 ML SYRINGE
INTRAMUSCULAR | Status: AC
  Filled 2017-12-15: qty 5

## 2017-12-15 MED ORDER — LIDOCAINE 2% (20 MG/ML) 5 ML SYRINGE
INTRAMUSCULAR | Status: DC | PRN
  Administered 2017-12-15: 80 mg via INTRAVENOUS

## 2017-12-15 MED ORDER — ACETAMINOPHEN 650 MG RE SUPP
650.0000 mg | RECTAL | Status: DC | PRN
  Filled 2017-12-15: qty 1

## 2017-12-15 MED ORDER — ROCURONIUM BROMIDE 50 MG/5ML IV SOSY
PREFILLED_SYRINGE | INTRAVENOUS | Status: DC | PRN
  Administered 2017-12-15 (×3): 20 mg via INTRAVENOUS
  Administered 2017-12-15: 50 mg via INTRAVENOUS

## 2017-12-15 MED ORDER — OXYCODONE HCL 5 MG PO TABS
5.0000 mg | ORAL_TABLET | Freq: Once | ORAL | Status: AC | PRN
  Administered 2017-12-15: 5 mg via ORAL

## 2017-12-15 MED ORDER — ROCURONIUM BROMIDE 50 MG/5ML IV SOSY
PREFILLED_SYRINGE | INTRAVENOUS | Status: AC
  Filled 2017-12-15: qty 5

## 2017-12-15 MED ORDER — SODIUM CHLORIDE 0.9 % IV SOLN
INTRAVENOUS | Status: DC | PRN
  Administered 2017-12-15: 20 ug/min via INTRAVENOUS

## 2017-12-15 MED ORDER — 0.9 % SODIUM CHLORIDE (POUR BTL) OPTIME
TOPICAL | Status: DC | PRN
  Administered 2017-12-15: 1000 mL

## 2017-12-15 MED ORDER — LIDOCAINE-EPINEPHRINE 1 %-1:100000 IJ SOLN
INTRAMUSCULAR | Status: AC
  Filled 2017-12-15: qty 1

## 2017-12-15 MED ORDER — OXYCODONE HCL 5 MG PO TABS
ORAL_TABLET | ORAL | Status: AC
  Filled 2017-12-15: qty 1

## 2017-12-15 MED ORDER — EPHEDRINE SULFATE 50 MG/ML IJ SOLN
INTRAMUSCULAR | Status: DC | PRN
  Administered 2017-12-15: 5 mg via INTRAVENOUS

## 2017-12-15 MED ORDER — EPHEDRINE 5 MG/ML INJ
INTRAVENOUS | Status: AC
  Filled 2017-12-15: qty 10

## 2017-12-15 MED ORDER — NEOSTIGMINE METHYLSULFATE 3 MG/3ML IV SOSY
PREFILLED_SYRINGE | INTRAVENOUS | Status: AC
  Filled 2017-12-15: qty 3

## 2017-12-15 MED ORDER — SODIUM CHLORIDE 0.9 % IV SOLN: 250.0000 mL | INTRAVENOUS | Status: DC

## 2017-12-15 MED ORDER — ACETAMINOPHEN 325 MG PO TABS
650.0000 mg | ORAL_TABLET | ORAL | Status: DC | PRN
  Administered 2017-12-15 – 2017-12-16 (×2): 650 mg via ORAL
  Filled 2017-12-15 (×2): qty 2

## 2017-12-15 MED ORDER — CEFAZOLIN SODIUM-DEXTROSE 2-4 GM/100ML-% IV SOLN
2.0000 g | Freq: Three times a day (TID) | INTRAVENOUS | Status: AC
  Administered 2017-12-15 (×2): 2 g via INTRAVENOUS
  Filled 2017-12-15 (×2): qty 100

## 2017-12-15 MED ORDER — CHLORHEXIDINE GLUCONATE CLOTH 2 % EX PADS: 6.0000 | MEDICATED_PAD | Freq: Once | CUTANEOUS | Status: DC

## 2017-12-15 MED ORDER — SUGAMMADEX SODIUM 200 MG/2ML IV SOLN
INTRAVENOUS | Status: DC | PRN
  Administered 2017-12-15: 200 mg via INTRAVENOUS

## 2017-12-15 MED ORDER — CYCLOBENZAPRINE HCL 10 MG PO TABS
10.0000 mg | ORAL_TABLET | Freq: Three times a day (TID) | ORAL | Status: DC | PRN
  Administered 2017-12-15 – 2017-12-16 (×3): 10 mg via ORAL
  Filled 2017-12-15 (×3): qty 1

## 2017-12-15 MED ORDER — ONDANSETRON HCL 4 MG/2ML IJ SOLN
INTRAMUSCULAR | Status: AC
  Filled 2017-12-15: qty 2

## 2017-12-15 MED ORDER — THROMBIN 5000 UNITS EX SOLR
OROMUCOSAL | Status: DC | PRN
  Administered 2017-12-15: 07:00:00 via TOPICAL

## 2017-12-15 MED ORDER — MIDAZOLAM HCL 5 MG/5ML IJ SOLN
INTRAMUSCULAR | Status: DC | PRN
  Administered 2017-12-15: 2 mg via INTRAVENOUS

## 2017-12-15 MED ORDER — DEXAMETHASONE SODIUM PHOSPHATE 10 MG/ML IJ SOLN
INTRAMUSCULAR | Status: DC | PRN
  Administered 2017-12-15: 10 mg via INTRAVENOUS

## 2017-12-15 MED ORDER — SUCCINYLCHOLINE CHLORIDE 200 MG/10ML IV SOSY
PREFILLED_SYRINGE | INTRAVENOUS | Status: AC
  Filled 2017-12-15: qty 10

## 2017-12-15 MED ORDER — FENTANYL CITRATE (PF) 100 MCG/2ML IJ SOLN
INTRAMUSCULAR | Status: DC | PRN
  Administered 2017-12-15 (×2): 50 ug via INTRAVENOUS
  Administered 2017-12-15: 100 ug via INTRAVENOUS
  Administered 2017-12-15: 50 ug via INTRAVENOUS

## 2017-12-15 MED ORDER — HYDROMORPHONE HCL 1 MG/ML IJ SOLN
0.5000 mg | INTRAMUSCULAR | Status: DC | PRN
  Administered 2017-12-15: 0.5 mg via INTRAVENOUS
  Filled 2017-12-15: qty 0.5

## 2017-12-15 MED ORDER — SODIUM CHLORIDE 0.9% FLUSH: 3.0000 mL | INTRAVENOUS | Status: DC | PRN

## 2017-12-15 MED ORDER — CEFAZOLIN SODIUM-DEXTROSE 2-4 GM/100ML-% IV SOLN
2.0000 g | INTRAVENOUS | Status: AC
  Administered 2017-12-15: 2 g via INTRAVENOUS

## 2017-12-15 MED ORDER — ARTIFICIAL TEARS OPHTHALMIC OINT
TOPICAL_OINTMENT | OPHTHALMIC | Status: AC
  Filled 2017-12-15: qty 3.5

## 2017-12-15 MED ORDER — PROPOFOL 10 MG/ML IV BOLUS
INTRAVENOUS | Status: AC
  Filled 2017-12-15: qty 20

## 2017-12-15 MED ORDER — SODIUM CHLORIDE (PF) 0.9 % IJ SOLN
INTRAMUSCULAR | Status: AC
  Filled 2017-12-15: qty 10

## 2017-12-15 MED ORDER — MENTHOL 3 MG MT LOZG
1.0000 | LOZENGE | OROMUCOSAL | Status: DC | PRN
  Filled 2017-12-15 (×2): qty 9

## 2017-12-15 MED ORDER — FENTANYL CITRATE (PF) 100 MCG/2ML IJ SOLN
INTRAMUSCULAR | Status: AC
  Filled 2017-12-15: qty 2

## 2017-12-15 MED ORDER — PHENYLEPHRINE 40 MCG/ML (10ML) SYRINGE FOR IV PUSH (FOR BLOOD PRESSURE SUPPORT)
PREFILLED_SYRINGE | INTRAVENOUS | Status: AC
  Filled 2017-12-15: qty 10

## 2017-12-15 MED ORDER — LIDOCAINE-EPINEPHRINE 1 %-1:100000 IJ SOLN
INTRAMUSCULAR | Status: DC | PRN
  Administered 2017-12-15: 3 mL

## 2017-12-15 MED ORDER — FENTANYL CITRATE (PF) 100 MCG/2ML IJ SOLN
25.0000 ug | INTRAMUSCULAR | Status: DC | PRN
  Administered 2017-12-15 (×2): 50 ug via INTRAVENOUS

## 2017-12-15 MED ORDER — PHENYLEPHRINE HCL 10 MG/ML IJ SOLN
INTRAMUSCULAR | Status: DC | PRN
  Administered 2017-12-15: 80 ug via INTRAVENOUS
  Administered 2017-12-15: 40 ug via INTRAVENOUS

## 2017-12-15 MED ORDER — CEFAZOLIN SODIUM-DEXTROSE 2-4 GM/100ML-% IV SOLN
INTRAVENOUS | Status: AC
  Filled 2017-12-15: qty 100

## 2017-12-15 MED ORDER — POLYETHYLENE GLYCOL 3350 17 G PO PACK: 17.0000 g | PACK | Freq: Every day | ORAL | Status: DC | PRN

## 2017-12-15 MED ORDER — FENTANYL CITRATE (PF) 250 MCG/5ML IJ SOLN
INTRAMUSCULAR | Status: AC
  Filled 2017-12-15: qty 5

## 2017-12-15 MED ORDER — PANTOPRAZOLE SODIUM 40 MG PO TBEC
40.0000 mg | DELAYED_RELEASE_TABLET | Freq: Every day | ORAL | Status: DC
  Administered 2017-12-16: 40 mg via ORAL
  Filled 2017-12-15: qty 1

## 2017-12-15 MED ORDER — DOCUSATE SODIUM 100 MG PO CAPS
100.0000 mg | ORAL_CAPSULE | Freq: Two times a day (BID) | ORAL | Status: DC
  Administered 2017-12-15 – 2017-12-16 (×2): 100 mg via ORAL
  Filled 2017-12-15 (×2): qty 1

## 2017-12-15 MED ORDER — PHENOL 1.4 % MT LIQD: 1.0000 | OROMUCOSAL | Status: DC | PRN

## 2017-12-15 MED ORDER — OXYCODONE HCL 5 MG/5ML PO SOLN: 5.0000 mg | Freq: Once | ORAL | Status: AC | PRN

## 2017-12-15 MED ORDER — SODIUM CHLORIDE 0.9% FLUSH
3.0000 mL | Freq: Two times a day (BID) | INTRAVENOUS | Status: DC
  Administered 2017-12-15: 3 mL via INTRAVENOUS

## 2017-12-15 MED ORDER — THROMBIN 5000 UNITS EX SOLR
CUTANEOUS | Status: AC
  Filled 2017-12-15: qty 5000

## 2017-12-15 MED ORDER — ONDANSETRON HCL 4 MG PO TABS: 4.0000 mg | ORAL_TABLET | Freq: Four times a day (QID) | ORAL | Status: DC | PRN

## 2017-12-15 MED ORDER — ONDANSETRON HCL 4 MG/2ML IJ SOLN: 4.0000 mg | Freq: Once | INTRAMUSCULAR | Status: DC | PRN

## 2017-12-15 MED ORDER — PROPOFOL 10 MG/ML IV BOLUS
INTRAVENOUS | Status: DC | PRN
  Administered 2017-12-15: 170 mg via INTRAVENOUS

## 2017-12-15 MED ORDER — SODIUM CHLORIDE 0.9 % IV SOLN
INTRAVENOUS | Status: DC | PRN
  Administered 2017-12-15: 07:00:00

## 2017-12-15 SURGICAL SUPPLY — 53 items
BAG DECANTER FOR FLEXI CONT (MISCELLANEOUS) ×3 IMPLANT
BENZOIN TINCTURE PRP APPL 2/3 (GAUZE/BANDAGES/DRESSINGS) ×3 IMPLANT
BLADE CLIPPER SURG (BLADE) IMPLANT
BLADE SURG 11 STRL SS (BLADE) ×3 IMPLANT
BUR MATCHSTICK NEURO 3.0 LAGG (BURR) ×3 IMPLANT
CANISTER SUCT 3000ML PPV (MISCELLANEOUS) ×3 IMPLANT
COVER WAND RF STERILE (DRAPES) ×3 IMPLANT
DECANTER SPIKE VIAL GLASS SM (MISCELLANEOUS) ×3 IMPLANT
DERMABOND ADVANCED (GAUZE/BANDAGES/DRESSINGS) ×2
DERMABOND ADVANCED .7 DNX12 (GAUZE/BANDAGES/DRESSINGS) ×1 IMPLANT
DRAPE C-ARM 42X72 X-RAY (DRAPES) ×6 IMPLANT
DRAPE HALF SHEET 40X57 (DRAPES) IMPLANT
DRAPE LAPAROTOMY 100X72 PEDS (DRAPES) ×3 IMPLANT
DRAPE MICROSCOPE LEICA (MISCELLANEOUS) ×3 IMPLANT
DURAPREP 6ML APPLICATOR 50/CS (WOUND CARE) ×3 IMPLANT
ELECT COATED BLADE 2.86 ST (ELECTRODE) ×3 IMPLANT
ELECT REM PT RETURN 9FT ADLT (ELECTROSURGICAL) ×3
ELECTRODE REM PT RTRN 9FT ADLT (ELECTROSURGICAL) ×1 IMPLANT
GAUZE 4X4 16PLY RFD (DISPOSABLE) IMPLANT
GLOVE BIO SURGEON STRL SZ7.5 (GLOVE) ×3 IMPLANT
GLOVE BIOGEL PI IND STRL 7.5 (GLOVE) ×2 IMPLANT
GLOVE BIOGEL PI INDICATOR 7.5 (GLOVE) ×4
GLOVE EXAM NITRILE LRG STRL (GLOVE) IMPLANT
GLOVE EXAM NITRILE XL STR (GLOVE) IMPLANT
GLOVE EXAM NITRILE XS STR PU (GLOVE) IMPLANT
GOWN STRL REUS W/ TWL LRG LVL3 (GOWN DISPOSABLE) ×2 IMPLANT
GOWN STRL REUS W/ TWL XL LVL3 (GOWN DISPOSABLE) IMPLANT
GOWN STRL REUS W/TWL 2XL LVL3 (GOWN DISPOSABLE) IMPLANT
GOWN STRL REUS W/TWL LRG LVL3 (GOWN DISPOSABLE) ×4
GOWN STRL REUS W/TWL XL LVL3 (GOWN DISPOSABLE)
HEMOSTAT POWDER KIT SURGIFOAM (HEMOSTASIS) ×3 IMPLANT
KIT BASIN OR (CUSTOM PROCEDURE TRAY) ×3 IMPLANT
KIT TURNOVER KIT B (KITS) ×3 IMPLANT
NEEDLE HYPO 22GX1.5 SAFETY (NEEDLE) ×3 IMPLANT
NEEDLE SPNL 18GX3.5 QUINCKE PK (NEEDLE) ×3 IMPLANT
NS IRRIG 1000ML POUR BTL (IV SOLUTION) ×3 IMPLANT
PACK LAMINECTOMY NEURO (CUSTOM PROCEDURE TRAY) ×3 IMPLANT
PAD ARMBOARD 7.5X6 YLW CONV (MISCELLANEOUS) ×9 IMPLANT
PAD SHARPS MAGNETIC DISPOSAL (MISCELLANEOUS) ×3 IMPLANT
PIN DISTRACTION 14MM (PIN) ×6 IMPLANT
PLATE ELITE 21MM (Plate) ×3 IMPLANT
RUBBERBAND STERILE (MISCELLANEOUS) ×6 IMPLANT
SCREW SELF TAP VAR 4.0X13 (Screw) ×12 IMPLANT
SPACER BONE CORNERSTONE 5X14 (Orthopedic Implant) ×3 IMPLANT
SPONGE INTESTINAL PEANUT (DISPOSABLE) ×3 IMPLANT
SPONGE SURGIFOAM ABS GEL SZ50 (HEMOSTASIS) IMPLANT
STAPLER VISISTAT 35W (STAPLE) IMPLANT
SUT MNCRL AB 3-0 PS2 18 (SUTURE) ×3 IMPLANT
SUT VIC AB 3-0 SH 8-18 (SUTURE) ×3 IMPLANT
TAPE CLOTH 3X10 TAN LF (GAUZE/BANDAGES/DRESSINGS) ×3 IMPLANT
TOWEL GREEN STERILE (TOWEL DISPOSABLE) ×3 IMPLANT
TOWEL GREEN STERILE FF (TOWEL DISPOSABLE) ×3 IMPLANT
WATER STERILE IRR 1000ML POUR (IV SOLUTION) ×3 IMPLANT

## 2017-12-15 NOTE — Anesthesia Postprocedure Evaluation (Signed)
Anesthesia Post Note  Patient: Delilah ShanJohn Allain Michel Paske  Procedure(s) Performed: Anterior Cervical Decompression Fusion - Cervical five-Cervical six (N/A )     Patient location during evaluation: PACU Anesthesia Type: General Level of consciousness: awake and alert Pain management: pain level controlled Vital Signs Assessment: post-procedure vital signs reviewed and stable Respiratory status: spontaneous breathing, nonlabored ventilation and respiratory function stable Cardiovascular status: blood pressure returned to baseline and stable Postop Assessment: no apparent nausea or vomiting Anesthetic complications: no    Last Vitals:  Vitals:   12/15/17 1056 12/15/17 1111  BP:  (!) 133/92  Pulse:  71  Resp:  18  Temp: 37.1 C 36.8 C  SpO2:  95%    Last Pain:  Vitals:   12/15/17 1140  TempSrc:   PainSc: 6                  Beryle Lathehomas E Brock

## 2017-12-15 NOTE — Op Note (Signed)
PATIENT: Brandon Nunez  PROCEDURE DATE: 12/15/17  PRE-OPERATIVE DIAGNOSIS:  Cervical radiculopathy   POST-OPERATIVE DIAGNOSIS:  Cervical radiculopathy   PROCEDURE:  C5-C6 Anterior Cervical Discectomy and Instrumented Fusion   SURGEON:  Surgeon(s) and Role:    Jadene Pierinistergard, Pookela Sellin A, MD   ANESTHESIA: ETGA   BRIEF HISTORY: This is a 62yo man who presented with severe right upper extremity radicular pain and numbness in a C6 distribution. 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 Non-surgical treatment options were exhausted without relief of the patient's pain. This was discussed with the patient as well as risks, benefits, and alternatives and the patient wished to proceed with surgical treatment.   OPERATIVE DETAIL: The patient was taken to the operating room and placed on the OR table in the supine position. A formal time out was performed with two patient identifiers and confirmed the operative site. Anesthesia was induced by the anesthesia team.  Fluoroscopy was used to localize the surgical level and an incision was marked in a skin crease. The area was then prepped and draped in a sterile fashion. A transverse linear incision was made on the right side of the neck. The platysma was divided and the sternocleidomastoid muscle was identified. The carotid sheath was palpated, identified, and retracted laterally with the sternocleidomastoid muscle. The strap muscles were identified and retracted medially and the pretracheal fascia was entered. A bent spinal needle was used with fluoroscopy to localize the surgical level after dissection. The longus colli were elevated bilaterally and a self-retaining retractor was placed. The endotracheal tube cuff balloon was deflated and reinflated after retractor placement.   Anterior osteophytes were removed until flush with the anterior vertebral body. The disc annulus was incised and a complete C5-C6 discectomy was  performed. The posterior longitudinal ligament was incised followed by ligamentous and bony removal until no central canal stenosis was present. Decompression was then taken out laterally into the bilateral foramina until no foraminal stenosis was palpable. A 5mm cortical allograft (Medtronic) was inserted into the disc space as an interbody graft. An anterior plate ([employer]tronic) was positioned and 4, 13mm screws were used to secure the plate to the C5 and C6 vertebral bodies. Hemostasis was obtained and the incision was closed in layers. All instrument and sponge counts were correct. The patient was then returned to anesthesia for emergence. No apparent complications at the completion of the procedure.   EBL:  25mL   DRAINS: none   SPECIMENS: none   Jadene Pierinihomas A Indigo Chaddock, MD 12/15/17 9:38 AM

## 2017-12-15 NOTE — H&P (Addendum)
Surgical H&P Update  HPI: 62 y.o. man with RUE C6 radicular pain, here for surgical treatment. Initial symptoms consisted of neck pain, numbness, and radiating pain and radiographic workup revealed a C5-6 disc herniation with resulting left bilateral foraminal stenosis. No changes in health since she was last seen. Still having pain and wishes to proceed with surgery.  PMHx:  Past Medical History:  Diagnosis Date  . Cervical radiculopathy 2019   In Right C6 distribution: MRI C spine 10/18/17-->moderate C5-6 spinal stenosis, severe bilateral C5-6 neural foraminal narrowing. ESI no help. Dr. Maurice Smallstergard to do C5-6 anterior cerv discectomy with fusion as of 12/03/17 f/u.  Marland Kitchen. GERD (gastroesophageal reflux disease)   . Hyperlipidemia 11/22/2015   TLC x 1847mo--then recheck lipids unchanged--atorva 20 qd started 05/2016.  Marland Kitchen. Low back pain, episodic    MRI unremarkable at orthopedist's 2012  . Osteoarthritis of right knee 2019   with medial meniscus tear--managing conservatively as of 06/2017 [facility] f/u.   FamHx:  Family History  Problem Relation Age of Onset  . Cancer Mother        skin (unknown type per pt)  . Cancer Sister        lung (+smoker)   SocHx:  reports that he quit smoking about 6 months ago. His smoking use included cigars and cigarettes. He smoked 0.25 packs per day for 0.00 years. He has never used smokeless tobacco. He reports that he drinks alcohol. He reports that he does not use drugs.  Physical Exam: AOx3, PERRL, FS, TM  Strength 5/5 x4, SILTx4  Assesment/Plan: 62 y.o. 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 Risks, benefits, and alternatives discussed and the patient would like to continue with surgery. -OR today -3C post-op  Jadene Pierinihomas A Fey Coghill, MD 12/15/17 6:34 AM

## 2017-12-15 NOTE — Anesthesia Procedure Notes (Signed)
Procedure Name: Intubation Date/Time: 12/15/2017 7:33 AM Performed by: Epifanio LeschesMercer, Carisa Backhaus L, CRNA Pre-anesthesia Checklist: Patient identified, Emergency Drugs available, Suction available and Patient being monitored Patient Re-evaluated:Patient Re-evaluated prior to induction Oxygen Delivery Method: Circle System Utilized Preoxygenation: Pre-oxygenation with 100% oxygen Induction Type: IV induction Ventilation: Mask ventilation without difficulty and Oral airway inserted - appropriate to patient size Laryngoscope Size: Glidescope and 4 Grade View: Grade I Tube type: Oral Number of attempts: 1 Airway Equipment and Method: Stylet and Oral airway Placement Confirmation: ETT inserted through vocal cords under direct vision,  positive ETCO2 and breath sounds checked- equal and bilateral Secured at: 24 cm Tube secured with: Tape Dental Injury: Teeth and Oropharynx as per pre-operative assessment

## 2017-12-15 NOTE — Progress Notes (Signed)
Neurosurgery Service Post-operative progress note  Assessment & Plan: 62 y.o. man with cervical radiculopathy s/p ACDF, recovering well. -preop radicular pain resolved, recovering well -transfer to [facility]3C after PACU  Jadene Pierinihomas A Markita Stcharles  12/15/17 9:54 AM

## 2017-12-15 NOTE — Transfer of Care (Signed)
Immediate Anesthesia Transfer of Care Note  Patient: Brandon ShanJohn Allain Michel Nunez  Procedure(s) Performed: Anterior Cervical Decompression Fusion - Cervical five-Cervical six (N/A )  Patient Location: PACU  Anesthesia Type:General  Level of Consciousness: awake and drowsy  Airway & Oxygen Therapy: Patient Spontanous Breathing and Patient connected to nasal cannula oxygen  Post-op Assessment: Report given to RN and Post -op Vital signs reviewed and stable  Post vital signs: Reviewed and stable  Last Vitals:  Vitals Value Taken Time  BP 119/86 12/15/2017  9:41 AM  Temp    Pulse 65 12/15/2017  9:48 AM  Resp 14 12/15/2017  9:48 AM  SpO2 95 % 12/15/2017  9:48 AM  Vitals shown include unvalidated device data.  Last Pain:  Vitals:   12/15/17 0941  PainSc: (P) 0-No pain      Patients Stated Pain Goal: 3 (12/15/17 [id number])  Complications: No apparent anesthesia complications

## 2017-12-15 NOTE — Brief Op Note (Signed)
12/15/2017  9:37 AM  PATIENT:  Delilah ShanJohn Allain Michel Kashani  62 y.o. male  PRE-OPERATIVE DIAGNOSIS:  Cervical foraminal stenosis  POST-OPERATIVE DIAGNOSIS:  Cervical foraminal stenosis  PROCEDURE:  Procedure(s): Anterior Cervical Decompression Fusion - Cervical five-Cervical six (N/A)  SURGEON:  Surgeon(s) and Role:    * Jermone Geister, Clovis Puhomas A, MD - Primary  ANESTHESIA:   general  EBL:  50 mL   BLOOD ADMINISTERED:none  DRAINS: none   LOCAL MEDICATIONS USED:  LIDOCAINE   SPECIMEN:  No Specimen  DISPOSITION OF SPECIMEN:  N/A  COUNTS:  YES  TOURNIQUET:  * No tourniquets in log *  DICTATION: .Note written in EPIC  PLAN OF CARE: Admit for overnight observation  PATIENT DISPOSITION:  PACU - hemodynamically stable.   Delay start of Pharmacological VTE agent (>24hrs) due to surgical blood loss or risk of bleeding: yes

## 2017-12-16 ENCOUNTER — Encounter (HOSPITAL_COMMUNITY): Payer: Self-pay | Admitting: Neurological Surgery

## 2017-12-16 DIAGNOSIS — M4802 Spinal stenosis, cervical region: Secondary | ICD-10-CM | POA: Diagnosis not present

## 2017-12-16 MED ORDER — CYCLOBENZAPRINE HCL 10 MG PO TABS: 10.0000 mg | ORAL_TABLET | Freq: Three times a day (TID) | ORAL | 2 refills | Status: DC | PRN

## 2017-12-16 MED ORDER — DOCUSATE SODIUM 100 MG PO CAPS: 100.0000 mg | ORAL_CAPSULE | Freq: Two times a day (BID) | ORAL | 0 refills | Status: AC

## 2017-12-16 MED ORDER — OXYCODONE HCL 5 MG PO TABS: 5.0000 mg | ORAL_TABLET | ORAL | 0 refills | Status: DC | PRN

## 2017-12-16 NOTE — Discharge Instructions (Signed)
Discharge Instructions ° °No restriction in activities, slowly increase your activity back to normal.  ° °Your incision is closed with dermabond (purple glue). This will naturally fall off over the next 1-2 weeks.  ° °Okay to shower on the day of discharge. Use regular soap and water and try to be gentle when cleaning your incision.  ° °Follow up with Dr. Shalen Petrak in 2 weeks after discharge. If you do not already have a discharge appointment, please call his office at [phone] to schedule a follow up appointment. If you have any concerns or questions, please call the office and let us know. °

## 2017-12-16 NOTE — Evaluation (Signed)
Physical Therapy Evaluation and Discharge Patient Details Name: Brandon Nunez Brandon Nunez MRN: [id number] DOB: 1955/07/21 Today's Date: 12/16/2017   History of Present Illness  Pt is a 62 y/o male s/p C5-6 ACDF. PMHx includes GERD, hyperlipidemia, radiculopathy   Clinical Impression  Patient evaluated by Physical Therapy with no further acute PT needs identified. All education has been completed and the patient has no further questions. At the time of PT eval pt was able to perform transfers and ambulation with gross modified independence and no AD. Pt was educated on car transfer, precautions, positioning recommendations, and safe activity progression. See below for any follow-up Physical Therapy or equipment needs. PT is signing off. Thank you for this referral.     Follow Up Recommendations No PT follow up;Supervision for mobility/OOB    Equipment Recommendations  None recommended by PT    Recommendations for Other Services       Precautions / Restrictions Precautions Precautions: Cervical;Fall Precaution Booklet Issued: Yes (comment) Precaution Comments: Pt was cued for maintenance of precautions during functional mobility Required Braces or Orthoses: (no brace needed per order set) Restrictions Weight Bearing Restrictions: No      Mobility  Bed Mobility Overal bed mobility: Needs Assistance Bed Mobility: Rolling;Sidelying to Sit Rolling: Min guard Sidelying to sit: Min guard       General bed mobility comments: Pt was received standing in room with OT present  Transfers Overall transfer level: Modified independent Equipment used: None             General transfer comment: Pt demonstrated proper hand placement on seated surface for safety with stand>sit transfer.   Ambulation/Gait Ambulation/Gait assistance: Modified independent (Device/Increase time) Gait Distance (Feet): 800 Feet Assistive device: None Gait Pattern/deviations: Step-through  pattern;Decreased stride length Gait velocity: Decreased Gait velocity interpretation: 1.31 - 2.62 ft/sec, indicative of limited community ambulator General Gait Details: VC's for improved posture and maintenance of precautions throughout gait training. Occasional mild unsteadiness noted but pt was able to recover without assistance.   Stairs         General stair comments: Pt declined stair training - only 1 STE. Verbally educated on safety.  Wheelchair Mobility    Modified Rankin (Stroke Patients Only)       Balance Overall balance assessment: Mild deficits observed, not formally tested                                           Pertinent Vitals/Pain Pain Assessment: Faces Faces Pain Scale: Hurts little more Pain Location: neck at incision site Pain Descriptors / Indicators: Discomfort;Grimacing;Sore Pain Intervention(s): Monitored during session;Repositioned    Home Living Family/patient expects to be discharged to:: Private residence Living Arrangements: Spouse/significant other Available Help at Discharge: Family;Available PRN/intermittently(spouse can be available as much as needed) Type of Home: House Home Access: Stairs to enter   Entrance Stairs-Number of Steps: 1 Home Layout: One level Home Equipment: None      Prior Function Level of Independence: Independent               Hand Dominance   Dominant Hand: Right    Extremity/Trunk Assessment   Upper Extremity Assessment Upper Extremity Assessment: RUE deficits/detail;LUE deficits/detail RUE Deficits / Details: pt reports initial numbness/tingling in first digit and thumb, reports has mostly subsided since surgery LUE Deficits / Details: pt reports initial numbness/tingling in first digit and thumb,  reports has mostly subsided since surgery    Lower Extremity Assessment Lower Extremity Assessment: Overall WFL for tasks assessed    Cervical / Trunk Assessment Cervical /  Trunk Assessment: Other exceptions Cervical / Trunk Exceptions: s/p cervical sx  Communication   Communication: No difficulties  Cognition Arousal/Alertness: Awake/alert Behavior During Therapy: WFL for tasks assessed/performed Overall Cognitive Status: Within Functional Limits for tasks assessed                                        General Comments      Exercises     Assessment/Plan    PT Assessment Patent does not need any further PT services  PT Problem List         PT Treatment Interventions      PT Goals (Current goals can be found in the Care Plan section)  Acute Rehab PT Goals Patient Stated Goal: maintain independence PT Goal Formulation: All assessment and education complete, DC therapy    Frequency     Barriers to discharge        Co-evaluation               AM-PAC PT "6 Clicks" Mobility  Outcome Measure Help needed turning from your back to your side while in a flat bed without using bedrails?: None Help needed moving from lying on your back to sitting on the side of a flat bed without using bedrails?: None Help needed moving to and from a bed to a chair (including a wheelchair)?: None Help needed standing up from a chair using your arms (e.g., wheelchair or bedside chair)?: None Help needed to walk in hospital room?: None Help needed climbing 3-5 steps with a railing? : None 6 Click Score: 24    End of Session Equipment Utilized During Treatment: Gait belt Activity Tolerance: Patient tolerated treatment well Patient left: in chair;with call bell/phone within reach Nurse Communication: Mobility status PT Visit Diagnosis: Pain;Other symptoms and signs involving the nervous system (R29.898) Pain - part of body: (neck)    Time: 5409-8119 PT Time Calculation (min) (ACUTE ONLY): 14 min   Charges:   PT Evaluation $PT Eval Low Complexity: 1 Low          Conni Slipper, PT, DPT Acute Rehabilitation Services Pager:  [phone] Office: [phone]   Marylynn Pearson 12/16/2017, 9:11 AM

## 2017-12-16 NOTE — Discharge Summary (Signed)
Discharge Summary  Date of Admission: 12/15/2017  Date of Discharge: 12/16/17  Attending Physician: Autumn Pattyhomas Dondrell Loudermilk, MD  Hospital Course: Patient was admitted following an uncomplicated C5-6 ACDF. He was recovered in PACU and observed overnight in the spine recovery unit. His hospital course was uncomplicated and the patient was discharged home on POD1. He will follow up in clinic with me in 2 weeks.  Neurologic exam at discharge:  AOx3, PERRL, EOMI, FS, TM Strength 5/5 x4, SILTx4 except for bilateral C6 numbness  Jadene Pierinihomas A Reya Aurich, MD 12/16/17 7:16 AM

## 2017-12-16 NOTE — Progress Notes (Signed)
Neurosurgery Service Progress Note  Subjective: No acute events overnight, some expected neck pain but doing well, ambulated for ~90 minutes last night.   Objective: Vitals:   12/15/17 1617 12/15/17 1939 12/15/17 2310 12/16/17 0429  BP: (!) 133/96 (!) 150/95 129/87 134/87  Pulse: 80 82 78 64  Resp: 19 20 20 18   Temp: 98.2 F (36.8 C) 99.2 F (37.3 C) 98.7 F (37.1 C) 98.2 F (36.8 C)  TempSrc: Oral Oral Oral Oral  SpO2: 97% 97% 96% 98%  Weight:      Height:       Temp (24hrs), Avg:98.5 F (36.9 C), Min:97.9 F (36.6 C), Max:99.2 F (37.3 C)  CBC Latest Ref Rng & Units 12/09/2017 02/11/2017 03/04/2016  WBC 4.0 - 10.5 K/uL 6.9 7.5 6.3  Hemoglobin 13.0 - 17.0 g/dL 16.1 09.6 04.5  Hematocrit 39.0 - 52.0 % 45.1 46.6 44.7  Platelets 150 - 400 K/uL 167 157.0 169.0   BMP Latest Ref Rng & Units 12/09/2017 02/11/2017 03/04/2016  Glucose 70 - 99 mg/dL 409(W) 119(J) 97  BUN 8 - 23 mg/dL 15 17 19   Creatinine 0.61 - 1.24 mg/dL 4.78 2.95 6.21  Sodium 135 - 145 mmol/L 141 136 140  Potassium 3.5 - 5.1 mmol/L 4.0 3.9 4.2  Chloride 98 - 111 mmol/L 107 106 106  CO2 22 - 32 mmol/L 26 29 26   Calcium 8.9 - 10.3 mg/dL 9.0 9.0 9.3    Intake/Output Summary (Last 24 hours) at 12/16/2017 0714 Last data filed at 12/15/2017 1058 Gross per 24 hour  Intake 1350 ml  Output 50 ml  Net 1300 ml    Current Facility-Administered Medications:  .  0.9 %  sodium chloride infusion, 250 mL, Intravenous, Continuous, Ostergard, Thomas A, MD .  acetaminophen (TYLENOL) tablet 650 mg, 650 mg, Oral, Q4H PRN, 650 mg at 12/16/17 0203 **OR** acetaminophen (TYLENOL) suppository 650 mg, 650 mg, Rectal, Q4H PRN, Jadene Pierini, MD .  atorvastatin (LIPITOR) tablet 20 mg, 20 mg, Oral, Daily, Ostergard, Thomas A, MD .  cyclobenzaprine (FLEXERIL) tablet 10 mg, 10 mg, Oral, TID PRN, Jadene Pierini, MD, 10 mg at 12/16/17 0610 .  docusate sodium (COLACE) capsule 100 mg, 100 mg, Oral, BID, Jadene Pierini, MD, 100  mg at 12/15/17 2222 .  HYDROmorphone (DILAUDID) injection 0.5 mg, 0.5 mg, Intravenous, Q2H PRN, Jadene Pierini, MD, 0.5 mg at 12/15/17 1338 .  menthol-cetylpyridinium (CEPACOL) lozenge 3 mg, 1 lozenge, Oral, PRN **OR** phenol (CHLORASEPTIC) mouth spray 1 spray, 1 spray, Mouth/Throat, PRN, Ostergard, Thomas A, MD .  ondansetron (ZOFRAN) tablet 4 mg, 4 mg, Oral, Q6H PRN **OR** ondansetron (ZOFRAN) injection 4 mg, 4 mg, Intravenous, Q6H PRN, Ostergard, Thomas A, MD .  oxyCODONE (Oxy IR/ROXICODONE) immediate release tablet 10 mg, 10 mg, Oral, Q3H PRN, Jadene Pierini, MD, 10 mg at 12/16/17 0610 .  oxyCODONE (Oxy IR/ROXICODONE) immediate release tablet 5 mg, 5 mg, Oral, Q3H PRN, Jadene Pierini, MD .  pantoprazole (PROTONIX) EC tablet 40 mg, 40 mg, Oral, Daily, Ostergard, Thomas A, MD .  polyethylene glycol (MIRALAX / GLYCOLAX) packet 17 g, 17 g, Oral, Daily PRN, Ostergard, Thomas A, MD .  sodium chloride flush (NS) 0.9 % injection 3 mL, 3 mL, Intravenous, Q12H, Ostergard, Clovis Pu, MD, 3 mL at 12/15/17 2227 .  sodium chloride flush (NS) 0.9 % injection 3 mL, 3 mL, Intravenous, PRN, Ostergard, Clovis Pu, MD   Physical Exam: AOx3, PERRL, EOMI, FS, Strength 5/5 x4, SILT except for b/l  C6 numbness Incision c/d/i  Assessment & Plan: 62 y.o. man s/p C5-6 ACDF, recovering well. -discharge home this morning  Jadene Pierinihomas A Ostergard  12/16/17 7:14 AM

## 2017-12-16 NOTE — Progress Notes (Signed)
Pt doing well. Pt and wife given D/C instructions with Rx's, verbal understanding was provided. Pt's incision is clean and dry with no sign of infection. Pt's IV was removed prior to D/C. Pt D/C'd home via wheelchair per MD order. Pt is stable @ D/C and has no other needs at this time. Artelia Game, RN  

## 2017-12-16 NOTE — Evaluation (Addendum)
Occupational Therapy Evaluation Patient Details Name: Brandon ShanJohn Allain Michel Nunez MRN: [id number] DOB: 21-Jul-1955 Today's Date: 12/16/2017    History of Present Illness Pt is a 62 y/o male s/p C5-6 ACDF. PMHx includes GERD, hyperlipidemia, radiculopathy    Clinical Impression   This 62 y/o male presents with the above. At baseline pt is independent with ADLs and functional mobility. Pt performing functional mobility without AD and overall minguard assist this session. Pt demonstrates ability to perform LB ADL and shower transfer with minguard assist, UB ADL with mod independence. Educated pt on cervical precautions, AE, safety and compensatory strategies for performing ADLs and functional transfers while maintaining precautions; pt requiring intermittent cues to maintain/comply with precautions during functional tasks this session, though overall with good carryover. Pt reports he will return home with spouse who is able to assist with ADLs PRN. Questions answered throughout session with no further acute OT needs identified at this time. Feel pt is safe to return home from OT standpoint once medically ready. Acute OT to sign off, thank you for this referral.     Follow Up Recommendations  No OT follow up;Supervision - Intermittent    Equipment Recommendations  None recommended by OT           Precautions / Restrictions Precautions Precautions: Cervical;Fall Precaution Booklet Issued: Yes (comment) Precaution Comments: issued and reviewed with pt Required Braces or Orthoses: (no brace needed per order set) Restrictions Weight Bearing Restrictions: No      Mobility Bed Mobility Overal bed mobility: Needs Assistance Bed Mobility: Rolling;Sidelying to Sit Rolling: Min guard Sidelying to sit: Min guard       General bed mobility comments: educated pt on log roll technique, minguard for safety as pt requiring increased effort to perform, reports increased pain when putting pressure  through UEs  Transfers Overall transfer level: Modified independent Equipment used: None                  Balance Overall balance assessment: Mild deficits observed, not formally tested                                         ADL either performed or assessed with clinical judgement   ADL Overall ADL's : Needs assistance/impaired Eating/Feeding: Modified independent;Sitting   Grooming: Supervision/safety;Standing   Upper Body Bathing: Supervision/ safety;Standing   Lower Body Bathing: Min guard;Sit to/from stand Lower Body Bathing Details (indicate cue type and reason): educated on compensatory techniques for performing LB bathing as pt reports will most likely stand once cleared to shower; educated on use of LH sponge to increase ease of accessing feet and to maintain cervical precautions Upper Body Dressing : Set up;Sitting   Lower Body Dressing: Min guard;Sit to/from stand Lower Body Dressing Details (indicate cue type and reason): pt demonstrating figure 4 technique seated EOB Toilet Transfer: Min guard;Ambulation;Comfort height toilet Toilet Transfer Details (indicate cue type and reason): simulated in transfer to/from EOB Toileting- Clothing Manipulation and Hygiene: Min guard;Sit to/from stand Toileting - Clothing Manipulation Details (indicate cue type and reason): pt with questions re: reaching to perform peri-care after toileting tasks; educated on use of AE and demonstrated/simulated use of AE for increasing ease of task completion with pt verbalizing understanding, also discussed option of spouse assisting Tub/ Shower Transfer: Walk-in shower;Min guard;Ambulation Tub/Shower Transfer Details (indicate cue type and reason): pt performing transfer with minguard for safety, no  physical assist required; pt verbalizing understanding of needing to wait for clearance from MD until he is able to shower  Functional mobility during ADLs: Min guard General ADL  Comments: educated pt on cervical precautions, AE, safety and compensatory strategie for performing ADLs and functional transfers while maintaining precautions; pt requiring min cues to maintain/comply with precautions during functional task this session, though overall with good carryover     Vision         Perception     Praxis      Pertinent Vitals/Pain Pain Assessment: Faces Faces Pain Scale: Hurts even more Pain Location: neck at incision site Pain Descriptors / Indicators: Discomfort;Grimacing;Sore Pain Intervention(s): Monitored during session;Repositioned     Hand Dominance Right   Extremity/Trunk Assessment Upper Extremity Assessment Upper Extremity Assessment: RUE deficits/detail;LUE deficits/detail;Overall WFL for tasks assessed RUE Deficits / Details: pt reports initial numbness/tingling in first digit and thumb, reports has mostly subsided since surgery LUE Deficits / Details: pt reports initial numbness/tingling in first digit and thumb, reports has mostly subsided since surgery   Lower Extremity Assessment Lower Extremity Assessment: Defer to PT evaluation   Cervical / Trunk Assessment Cervical / Trunk Assessment: Other exceptions Cervical / Trunk Exceptions: s/p cervical sx   Communication Communication Communication: No difficulties   Cognition Arousal/Alertness: Awake/alert Behavior During Therapy: WFL for tasks assessed/performed Overall Cognitive Status: Within Functional Limits for tasks assessed                                     General Comments       Exercises     Shoulder Instructions      Home Living Family/patient expects to be discharged to:: Private residence Living Arrangements: Spouse/significant other Available Help at Discharge: Family;Available PRN/intermittently(spouse can be available as much as needed) Type of Home: House Home Access: Stairs to enter Entrance Stairs-Number of Steps: 1   Home Layout: One  level     Bathroom Shower/Tub: Producer, television/film/video: Handicapped height     Home Equipment: None          Prior Functioning/Environment Level of Independence: Independent                 OT Problem List: Decreased range of motion;Pain;Decreased knowledge of precautions;Impaired balance (sitting and/or standing)      OT Treatment/Interventions:      OT Goals(Current goals can be found in the care plan section) Acute Rehab OT Goals Patient Stated Goal: maintain independence OT Goal Formulation: All assessment and education complete, DC therapy  OT Frequency:     Barriers to D/C:            Co-evaluation              AM-PAC OT "6 Clicks" Daily Activity     Outcome Measure Help from another person eating meals?: None Help from another person taking care of personal grooming?: None Help from another person toileting, which includes using toliet, bedpan, or urinal?: A Little Help from another person bathing (including washing, rinsing, drying)?: None Help from another person to put on and taking off regular upper body clothing?: None Help from another person to put on and taking off regular lower body clothing?: None 6 Click Score: 23   End of Session Nurse Communication: Mobility status  Activity Tolerance: Patient tolerated treatment well Patient left: Other (comment)(handoff to PT to begin session)  OT Visit Diagnosis: Other abnormalities of gait and mobility (R26.89);Pain Pain - part of body: (neck)                Time: 1610-9604 OT Time Calculation (min): 19 min Charges:  OT General Charges $OT Visit: 1 Visit OT Evaluation $OT Eval Low Complexity: 1 Low  Marcy Siren, OT Supplemental Rehabilitation Services Pager [phone] Office [phone]   Orlando Penner 12/16/2017, 8:45 AM

## 2018-03-04 ENCOUNTER — Other Ambulatory Visit: Payer: Self-pay | Admitting: Family Medicine

## 2018-05-07 ENCOUNTER — Other Ambulatory Visit: Payer: Self-pay | Admitting: Family Medicine

## 2018-05-07 NOTE — Telephone Encounter (Signed)
Pt was called and message was left for patient to call back to schedule [facility] conditions appt. Pt has not been seen since 02/11/2017 for [facility].

## 2018-05-18 ENCOUNTER — Other Ambulatory Visit: Payer: Self-pay

## 2018-05-18 MED ORDER — ATORVASTATIN CALCIUM 20 MG PO TABS: 20.0000 mg | ORAL_TABLET | Freq: Every day | ORAL | 0 refills | Status: DC

## 2018-06-10 ENCOUNTER — Other Ambulatory Visit: Payer: Self-pay | Admitting: Family Medicine

## 2018-09-09 ENCOUNTER — Telehealth: Payer: Self-pay | Admitting: Family Medicine

## 2018-09-09 NOTE — Telephone Encounter (Signed)
Having some chest pain he feels all related to stress, he is not able to sleep  Requested appt to see Dr. Anitra Lauth next week He has no openings, because of chest pain sending to clinical staff to call patient  [phone]  Thank you

## 2018-09-09 NOTE — Telephone Encounter (Signed)
Chest pain x's 2-3 weeks. Only gets chest pain when he is nervous or anxious. He thinks its more stress related or anxiety. He said that he is not worried about his heart or he would of went to ED. Doesn't have SOB. Patient is not sleeping well. He is waking up a lot and feeling down. Told patient that if chest pain becomes worse or has any breathing difficulties to go to the ED. Also advised patient that if he becomes more anxious or depressed to call back or go to the hospital. Patient verbalized understanding. Patient would like to see Dr. Anitra Lauth but if not he would like to see someone else. Are you okay with a double booking for next week or would you like for [place] to put him with another provider until he can come see you? Any recommendations?

## 2018-09-09 NOTE — Telephone Encounter (Signed)
Can we please schedule for a VV Thursday or Friday at 4:15pm. Not unless Dr. Anitra Lauth writes back and says he needs to be seen sooner. Thank you

## 2018-09-09 NOTE — Telephone Encounter (Signed)
Chest pain->needs to see someone IN OFFICE BEFORE I get back.

## 2018-09-10 ENCOUNTER — Ambulatory Visit (INDEPENDENT_AMBULATORY_CARE_PROVIDER_SITE_OTHER): Payer: Managed Care, Other (non HMO) | Admitting: Internal Medicine

## 2018-09-10 ENCOUNTER — Encounter: Payer: Self-pay | Admitting: Internal Medicine

## 2018-09-10 ENCOUNTER — Other Ambulatory Visit: Payer: Self-pay

## 2018-09-10 VITALS — BP 115/92 | HR 63 | Temp 97.8°F | Resp 16 | Ht 72.0 in | Wt 183.1 lb

## 2018-09-10 DIAGNOSIS — F329 Major depressive disorder, single episode, unspecified: Secondary | ICD-10-CM | POA: Diagnosis not present

## 2018-09-10 DIAGNOSIS — R079 Chest pain, unspecified: Secondary | ICD-10-CM

## 2018-09-10 DIAGNOSIS — F419 Anxiety disorder, unspecified: Secondary | ICD-10-CM

## 2018-09-10 DIAGNOSIS — F32A Depression, unspecified: Secondary | ICD-10-CM

## 2018-09-10 MED ORDER — ESCITALOPRAM OXALATE 10 MG PO TABS: 10.0000 mg | ORAL_TABLET | Freq: Every day | ORAL | 1 refills | Status: AC

## 2018-09-10 NOTE — Patient Instructions (Addendum)
Please to schedule a office visit with Dr. Ernestine Conrad in 3 weeks  Start taking Lexapro 10 mg 1 tablet daily.  If  problems sleeping, take Tylenol PM  Consider counseling  If you ever have suicidal thoughts coming back seek help.   SUICIDE PREVENTION:  Find Help 24/7, [place] Call our 24-hour HelpLine at (619)337-8011 or [place]: 1-800-SUICIDE   The National Suicide Prevention Lifeline: [employer]

## 2018-09-10 NOTE — Progress Notes (Signed)
Subjective:    Patient ID: Brandon Nunez, male    DOB: [birth year], 63 y.o.   MRN: [id number]  DOS:  09/10/2018 Type of visit - description: Acute Chief complaint is anxiety and depression. Reports this is going on for years but has not mentioned it before. Symptoms are triggered by his job. Also his daughter has some sort of blood cancer and financially she is having a hard time The patient's sister had recurrent lung cancer diagnosed 6 weeks ago His brother is jobless.  Reports he does not discussed this type things with essentially anybody and he hold his feelings.  Also for several months, most nights he is unable to sleep and developed chest pressure at rest. No associated nausea or sweats.  Symptoms may last hours. Minimal to no symptoms during the daytime.  Review of Systems  Admits to occasional suicidal ideas, no plans, no attempts, reports no recent symptoms.  Past Medical History:  Diagnosis Date  . Cervical radiculopathy 2019   In Right C6 distribution: MRI C spine 10/18/17-->moderate C5-6 spinal stenosis, severe bilateral C5-6 neural foraminal narrowing. ESI no help. Dr. Maurice Small to do C5-6 anterior cerv discectomy with fusion as of 12/03/17 f/u.  Marland Kitchen GERD (gastroesophageal reflux disease)   . Hyperlipidemia 11/22/2015   TLC x 87mo--then recheck lipids unchanged--atorva 20 qd started 05/2016.  Marland Kitchen Low back pain, episodic    MRI unremarkable at orthopedist's 2012  . Osteoarthritis of right knee 2019   with medial meniscus tear--managing conservatively as of 06/2017 SM f/u.    Past Surgical History:  Procedure Laterality Date  . ANTERIOR CERVICAL DECOMP/DISCECTOMY FUSION N/A 12/15/2017   Procedure: Anterior Cervical Decompression Fusion - Cervical five-Cervical six;  Surgeon: Jadene Pierini, MD;  Location: [facility] OR;  Service: Neurosurgery;  Laterality: N/A;  . COLONOSCOPY  approx 2010   Normal pet pt: was told rpt 10 yrs  . HEMORRHOID SURGERY  approx 1998   . NASAL SINUS SURGERY      Social History   Socioeconomic History  . Marital status: Married    Spouse name: Not on file  . Number of children: Not on file  . Years of education: Not on file  . Highest education level: Not on file  Occupational History  . Not on file  Social Needs  . Financial resource strain: Not on file  . Food insecurity    Worry: Not on file    Inability: Not on file  . Transportation needs    Medical: Not on file    Non-medical: Not on file  Tobacco Use  . Smoking status: Former Smoker    Packs/day: 0.25    Years: 0.00    Pack years: 0.00    Types: Cigars, Cigarettes    Quit date: 06/08/2017    Years since quitting: 1.2  . Smokeless tobacco: Never Used  Substance and Sexual Activity  . Alcohol use: Yes    Comment: wine, whiskey daily  . Drug use: No  . Sexual activity: Not on file  Lifestyle  . Physical activity    Days per week: Not on file    Minutes per session: Not on file  . Stress: Not on file  Relationships  . Social Musician on phone: Not on file    Gets together: Not on file    Attends religious service: Not on file    Active member of club or organization: Not on file    Attends meetings of  clubs or organizations: Not on file    Relationship status: Not on file  . Intimate partner violence    Fear of current or ex partner: Not on file    Emotionally abused: Not on file    Physically abused: Not on file    Forced sexual activity: Not on file  Other Topics Concern  . Not on file  Social History Narrative   Remarried (1st wife died of breast cancer) 2008, has 1 biologic child and 2 stepchildren.   He is one of 11 kids, born in [place], moved around to many different countries with parents as he grew up.   [place].   Occupation: Art gallery managerngineer, Engineer, miningstudio design.   Used to be a long distance runner, still works out regularly.   Smokes occasional cigar, drinks one glass of wine and one whisky  drink nightly.   No hx of drug use.            Allergies as of 09/10/2018   No Known Allergies     Medication List       Accurate as of September 10, 2018  5:35 PM. If you have any questions, ask your nurse or doctor.        STOP taking these medications   cyclobenzaprine 10 MG tablet Commonly known as: FLEXERIL Stopped by: Willow OraJose Shaddai Shapley, MD   oxyCODONE 5 MG immediate release tablet Commonly known as: Oxy IR/ROXICODONE Stopped by: Willow OraJose Tinika Bucknam, MD     TAKE these medications   atorvastatin 20 MG tablet Commonly known as: LIPITOR Take 1 tablet (20 mg total) by mouth daily. OFFICE VISIT NEEED   docusate sodium 100 MG capsule Commonly known as: COLACE Take 1 capsule (100 mg total) by mouth 2 (two) times daily.   escitalopram 10 MG tablet Commonly known as: LEXAPRO Take 1 tablet (10 mg total) by mouth daily. Started by: Willow OraJose Arvilla Salada, MD   pantoprazole 40 MG tablet Commonly known as: PROTONIX TAKE 1 TABLET (40 MG TOTAL) BY MOUTH DAILY. What changed: when to take this   Qunol Ultra CoQ10 100-150 MG-UNIT Caps Generic drug: Coenzyme Q10-Vitamin E Take 1 capsule by mouth daily. QUNOL           Objective:   Physical Exam BP (!) 115/92 (BP Location: Left Arm, Patient Position: Sitting, Cuff Size: Small)   Pulse 63   Temp 97.8 F (36.6 C) (Temporal)   Resp 16   Ht 6' (1.829 m)   Wt 183 lb 2 oz (83.1 kg)   SpO2 98%   BMI 24.84 kg/m  General:   Well developed,  BMI noted.  HEENT:  Normocephalic . Face symmetric, atraumatic Neck: Normal carotid arteries Lungs:  CTA B Normal respiratory effort, no intercostal retractions, no accessory muscle use. Heart: RRR,  no murmur.  no pretibial edema bilaterally  Abdomen:  Not distended, soft, non-tender. No rebound or rigidity.   Skin: Not pale. Not jaundice Neurologic:  alert & oriented X3.  Speech normal, gait appropriate for age and unassisted Psych--  Cognition and judgment appear intact.  Cooperative with normal  attention span and concentration.  As soon as we started to talk about his symptoms, he become tearful.     Assessment    63 year old male, PMH includes hyperlipidemia, DJD, GERD, presents with  Depression, anxiety: PHQ 9: 19 sxs going on for a while, see HPI, obviously he has a lot on him.  He feels ready to get help. I encouraged him to  establish with a counselor, information provided Start Lexapro 10 mg nightly, side effects including increase suicidal ideas discussed.  Knows to seek help if that ever happened, see AVS. Follow-up with PCP in 3 weeks  Chest pressure: As described above, going on for months, at rest, last hours.  Cholesterol is well controlled per last FLP. EKG today NSR, no acute changes, no change compared to previous one. Most likely this is noncardiac nevertheless recommend ER if severe, unusual or persistent pain. Follow-up with PCP

## 2018-09-10 NOTE — Telephone Encounter (Signed)
Patient has been scheduled today with Dr. Larose Kells

## 2018-09-10 NOTE — Progress Notes (Signed)
Pre visit review using our clinic review tool, if applicable. No additional management support is needed unless otherwise documented below in the visit note. 

## 2018-09-10 NOTE — Telephone Encounter (Signed)
Tried calling patient and lvm to call me back. Patient needs to be seen in office. We have a opening today and tomorrow. Need to confirm with Dr. Raoul Pitch if it is okay before scheduling. If he calls back please schedule. OV ONLY.

## 2018-09-16 ENCOUNTER — Other Ambulatory Visit: Payer: Self-pay

## 2018-09-16 MED ORDER — ATORVASTATIN CALCIUM 20 MG PO TABS: 20.0000 mg | ORAL_TABLET | Freq: Every day | ORAL | 0 refills | Status: DC

## 2018-09-16 MED ORDER — PANTOPRAZOLE SODIUM 40 MG PO TBEC: 40.0000 mg | DELAYED_RELEASE_TABLET | Freq: Every day | ORAL | 0 refills | Status: DC

## 2018-09-16 NOTE — Telephone Encounter (Signed)
Spoke with patient about scheduling 3 week follow up appointment with PCP to follow up after recent visit with Dr. Larose Kells. Patient states that in 3 weeks (roughly around 10/07/2018) patient is moving to [place] permanently. Patient also states he needs to get refill on Atorvastatin and Pantoprazole for 90 day supply please to get him through until he finds new PCP. Patient states his insurance will run out this Friday 09/18/2018 at midnight. Please review.

## 2018-10-02 ENCOUNTER — Other Ambulatory Visit: Payer: Self-pay | Admitting: Internal Medicine

## 2018-10-02 NOTE — Telephone Encounter (Signed)
LM for pt to return call regarding denial for refill and sent same info in a MyChart message.

## 2018-10-02 NOTE — Telephone Encounter (Signed)
I'm going to deny this RF. Dr. Larose Kells started him on this med 09/10/18 and he gave #30 with 1 RF-->he should have plenty pills left. He needs to set up a follow up appt with ME (telemedicine OR in office, either one is fine) in the next 2 weeks or so before I rx anymore of this med.

## 2018-12-24 ENCOUNTER — Other Ambulatory Visit: Payer: Self-pay | Admitting: Family Medicine

## 2018-12-24 MED ORDER — PANTOPRAZOLE SODIUM 40 MG PO TBEC: 40.0000 mg | DELAYED_RELEASE_TABLET | Freq: Every day | ORAL | 0 refills | Status: AC

## 2018-12-24 MED ORDER — ATORVASTATIN CALCIUM 20 MG PO TABS: 20.0000 mg | ORAL_TABLET | Freq: Every day | ORAL | 0 refills | Status: AC

## 2018-12-24 NOTE — Telephone Encounter (Signed)
Contacted patient to schedule CPE - due January 2021.  Patient has moved to [place] and is going abroad until 01/19/2019.  Patient would like to have RX sent in for 90 days if possible so when he returns to [place] he will be able to get set up with new physician.  Please advise if okay to send?

## 2018-12-24 NOTE — Telephone Encounter (Signed)
Patient is requesting pantoprazole (PROTONIX) 40 MG tablet, atorvastatin (LIPITOR) 20 MG tablet Rx [facility] [address], [place] [address] ph [phone]

## 2019-01-02 ENCOUNTER — Other Ambulatory Visit: Payer: Self-pay | Admitting: Family Medicine

## 2020-03-31 IMAGING — RF DG CERVICAL SPINE 1V
1 series · 2 of 2 positions shown · non-contrast
Comparison: MRI cervical spine dated October 18, 2017.

FLUOROSCOPY TIME:  15 seconds.

CLINICAL DATA: C5-C6 ACDF.

EXAM:
DG C-ARM 61-120 MIN; DG CERVICAL SPINE - 1 VIEW

[Series 1: run · 2 of 2 slices shown]
[im 1/2]
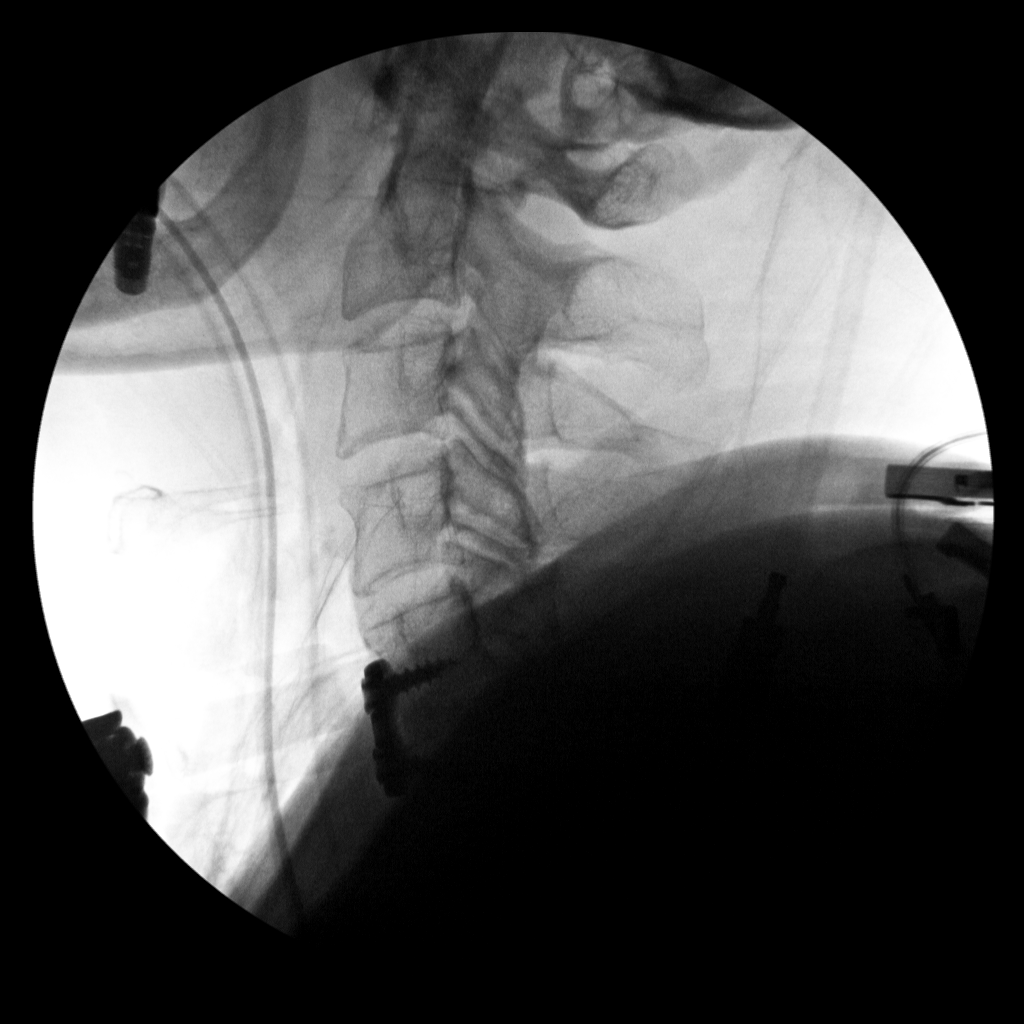
[im 2/2]
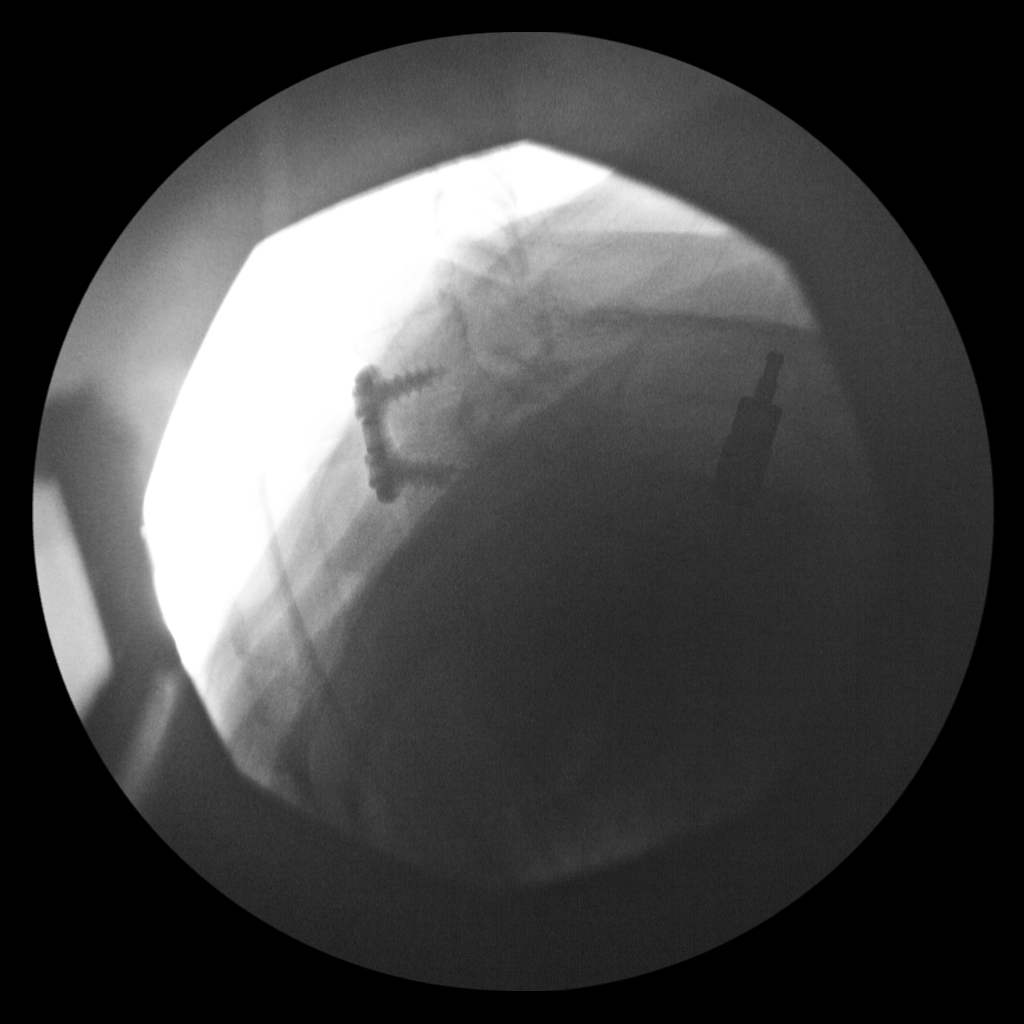

[2 of 2 positions shown; findings below may reference images not displayed]

FINDINGS: Lateral intraoperative fluoroscopic images demonstrate interval
C5-C6 ACDF. Alignment is normal. Vertebral body heights are
preserved.
IMPRESSION: 1. Intraoperative fluoroscopic guidance for C5-C6 ACDF. No acute
abnormality.
